# Patient Record
Sex: Female | Born: 1965 | Race: White | Hispanic: No | Marital: Married | Smoking: Former smoker
Health system: Southern US, Community
[De-identification: ages and names within clinical notes are randomized; demographics above are authoritative.]

## PROBLEM LIST (undated history)

## (undated) DIAGNOSIS — F32A Depression, unspecified: Secondary | ICD-10-CM

## (undated) DIAGNOSIS — E079 Disorder of thyroid, unspecified: Secondary | ICD-10-CM

## (undated) DIAGNOSIS — M1990 Unspecified osteoarthritis, unspecified site: Secondary | ICD-10-CM

## (undated) DIAGNOSIS — T7840XA Allergy, unspecified, initial encounter: Secondary | ICD-10-CM

## (undated) DIAGNOSIS — F419 Anxiety disorder, unspecified: Secondary | ICD-10-CM

## (undated) DIAGNOSIS — J45909 Unspecified asthma, uncomplicated: Secondary | ICD-10-CM

## (undated) DIAGNOSIS — Z1509 Genetic susceptibility to other malignant neoplasm: Secondary | ICD-10-CM

## (undated) DIAGNOSIS — Z860101 Personal history of adenomatous and serrated colon polyps: Secondary | ICD-10-CM

## (undated) DIAGNOSIS — IMO0002 Reserved for concepts with insufficient information to code with codable children: Secondary | ICD-10-CM

## (undated) DIAGNOSIS — Z86010 Personal history of colonic polyps: Secondary | ICD-10-CM

## (undated) DIAGNOSIS — N63 Unspecified lump in unspecified breast: Secondary | ICD-10-CM

## (undated) DIAGNOSIS — Z1501 Genetic susceptibility to malignant neoplasm of breast: Secondary | ICD-10-CM

## (undated) DIAGNOSIS — E7800 Pure hypercholesterolemia, unspecified: Secondary | ICD-10-CM

## (undated) DIAGNOSIS — N189 Chronic kidney disease, unspecified: Secondary | ICD-10-CM

## (undated) DIAGNOSIS — I776 Arteritis, unspecified: Secondary | ICD-10-CM

## (undated) DIAGNOSIS — F329 Major depressive disorder, single episode, unspecified: Secondary | ICD-10-CM

## (undated) HISTORY — DX: Personal history of adenomatous and serrated colon polyps: Z86.0101

## (undated) HISTORY — DX: Unspecified osteoarthritis, unspecified site: M19.90

## (undated) HISTORY — DX: Genetic susceptibility to other malignant neoplasm: Z15.09

## (undated) HISTORY — DX: Personal history of colonic polyps: Z86.010

## (undated) HISTORY — DX: Arteritis, unspecified: I77.6

## (undated) HISTORY — DX: Chronic kidney disease, unspecified: N18.9

## (undated) HISTORY — DX: Major depressive disorder, single episode, unspecified: F32.9

## (undated) HISTORY — DX: Unspecified asthma, uncomplicated: J45.909

## (undated) HISTORY — DX: Disorder of thyroid, unspecified: E07.9

## (undated) HISTORY — DX: Reserved for concepts with insufficient information to code with codable children: IMO0002

## (undated) HISTORY — PX: BLADDER SUSPENSION: SHX72

## (undated) HISTORY — DX: Allergy, unspecified, initial encounter: T78.40XA

## (undated) HISTORY — DX: Pure hypercholesterolemia, unspecified: E78.00

## (undated) HISTORY — DX: Depression, unspecified: F32.A

## (undated) HISTORY — PX: REFRACTIVE SURGERY: SHX103

## (undated) HISTORY — DX: Genetic susceptibility to malignant neoplasm of breast: Z15.01

## (undated) HISTORY — PX: EYE SURGERY: SHX253

## (undated) HISTORY — DX: Anxiety disorder, unspecified: F41.9

## (undated) HISTORY — PX: MOUTH SURGERY: SHX715

---

## 2002-06-01 HISTORY — PX: ABDOMINAL HYSTERECTOMY: SHX81

## 2013-03-24 ENCOUNTER — Ambulatory Visit: Payer: Self-pay | Admitting: Obstetrics and Gynecology

## 2013-04-07 ENCOUNTER — Encounter: Payer: Self-pay | Admitting: Obstetrics and Gynecology

## 2013-04-07 ENCOUNTER — Ambulatory Visit (INDEPENDENT_AMBULATORY_CARE_PROVIDER_SITE_OTHER): Payer: 59 | Admitting: Obstetrics and Gynecology

## 2013-04-07 ENCOUNTER — Other Ambulatory Visit: Payer: Self-pay | Admitting: Obstetrics and Gynecology

## 2013-04-07 VITALS — BP 128/80 | HR 66 | Ht 66.5 in | Wt 204.0 lb

## 2013-04-07 DIAGNOSIS — Z01419 Encounter for gynecological examination (general) (routine) without abnormal findings: Secondary | ICD-10-CM

## 2013-04-07 DIAGNOSIS — Z0000 Encounter for general adult medical examination without abnormal findings: Secondary | ICD-10-CM

## 2013-04-07 DIAGNOSIS — Z1231 Encounter for screening mammogram for malignant neoplasm of breast: Secondary | ICD-10-CM

## 2013-04-07 LAB — POCT URINALYSIS DIPSTICK
Glucose, UA: NEGATIVE
Nitrite, UA: NEGATIVE
Protein, UA: NEGATIVE
Urobilinogen, UA: NEGATIVE
pH, UA: 5

## 2013-04-07 NOTE — Progress Notes (Signed)
Patient ID: Holly Mcdaniel, female   DOB: 1965-06-15, 47 y.o.   MRN: [id number] GYNECOLOGY VISIT  PCP:   Alona Bene, MD  Referring provider:   HPI: 47 y.o.   Married  Caucasian  female   [phone] with Patient's last menstrual period was 06/01/2002.   here for   AEX. Some hot flashes, night sweats, vaginal dryness. Estroven helpful to a degree.  Manageable hot flashes.  Gel treats dryness. Sleeps with a fan on.   Doing weight watchers.  Has lost weight but has gained back some. Exercising regularly.   Hgb:     PCP Urine:   Neg  GYNECOLOGIC HISTORY: Patient's last menstrual period was 06/01/2002. Sexually active:  yes Partner preference: female Contraception:   hysterectomy Menopausal hormone therapy: no DES exposure:  no  Blood transfusions: no   Sexually transmitted diseases:   no GYN Procedures:  Total Vaginal Hysterectomy and bladder tack.  Indication was dyspareunia and possible "adenomyosis".  Still has ovaries.  Pain resolved.  Mammogram:  2010 wnl:[facility] [place]               Pap:   2010 wnl History of abnormal pap smear:  no   OB History   Grav Para Term Preterm Abortions TAB SAB Ect Mult Living   3 3 3       3        LIFESTYLE: Exercise:     Works out at gym sometimes          Tobacco:     Smokes socially x 20years Alcohol:        2-3 drinks per month Drug use:     no  OTHER HEALTH MAINTENANCE: Tetanus/TDap:    Within last year Gardisil:  NA Influenza:     03/2013 Zostavax:  NA  Bone density:  NA Colonoscopy:  NA  Cholesterol check:   10/2012 wnl   Family History  Problem Relation Age of Onset  . Diabetes Mother     borderline  . Hypertension Mother   . Thyroid disease Mother   . Asthma Brother   . Asthma Brother     There are no active problems to display for this patient.  Past Medical History  Diagnosis Date  . Depression   . Thyroid disease   . Hypercholesteremia   . Dyspareunia     Past Surgical History  Procedure Laterality Date  .  Abdominal hysterectomy  2004    TVH--still has ovaries  . Bladder suspension    . Carpal tunnel release Bilateral   . Mouth surgery      ALLERGIES: Erythromycin  Current Outpatient Prescriptions  Medication Sig Dispense Refill  . Cholecalciferol (VITAMIN D3) 2000 UNITS capsule Take 2,000 Units by mouth daily.      . CRESTOR 20 MG tablet Take 20 mg by mouth daily.      . fexofenadine (ALLEGRA) 180 MG tablet Take 180 mg by mouth daily.      Marland Kitchen levothyroxine (SYNTHROID, LEVOTHROID) 50 MCG tablet Take 50 mcg by mouth daily.      . Nutritional Supplements (ESTROVEN WEIGHT MANAGEMENT PO) Take 1 tablet by mouth 2 (two) times daily.       No current facility-administered medications for this visit.     ROS:  Pertinent items are noted in HPI.  SOCIAL HISTORY:   Married.  Works for [employer].   PHYSICAL EXAMINATION:    BP 128/80  Pulse 66  Ht 5' 6.5" (1.689 m)  [employer]  204 lb (92.534 kg)  BMI 32.44 kg/m2  LMP 06/01/2002   Wt Readings from Last 3 Encounters:  04/07/13 204 lb (92.534 kg)     Ht Readings from Last 3 Encounters:  04/07/13 5' 6.5" (1.689 m)    General appearance: alert, cooperative and appears stated age Head: Normocephalic, without obvious abnormality, atraumatic Neck: no adenopathy, supple, symmetrical, trachea midline and thyroid not enlarged, symmetric, no tenderness/mass/nodules Lungs: clear to auscultation bilaterally Breasts: Inspection negative, No nipple retraction or dimpling, No nipple discharge or bleeding, No axillary or supraclavicular adenopathy, Normal to palpation without dominant masses Heart: regular rate and rhythm Abdomen: soft, non-tender; no masses,  no organomegaly Extremities: extremities normal, atraumatic, no cyanosis or edema Skin: Skin color, texture, turgor normal. No rashes or lesions Lymph nodes: Cervical, supraclavicular, and axillary nodes normal. No abnormal inguinal nodes palpated Neurologic: Grossly normal  Pelvic: External  genitalia:  no lesions              Urethra:  normal appearing urethra with no masses, tenderness or lesions              Bartholins and Skenes: normal                 Vagina: normal appearing vagina with normal color and discharge, no lesions.  Likely midurethral sling is palpable under the mucosal of the anterior vaginal wall. No exposure or erosion.               Cervix:  absent              Pap and high risk HPV testing done: no.            Bimanual Exam:  Uterus:   absent                                      Adnexa: normal adnexa in size, nontender and no masses                                      Rectovaginal: Confirms                                      Anus:  normal sphincter tone, no lesions  ASSESSMENT  Normal gynecologic exam. Status post TVH. Status post probable midurethral sling.  Smoker.  Declines cessation.  Menopausal symptoms tolerable.   PLAN  Mammogram at [facility]. Order placed.  Patient will schedule. Pap smear and high risk HPV testing not indicated.  Labs with PCP. Colonoscopy next year.  Return annually or prn   An After Visit Summary was printed and given to the patient.

## 2013-04-07 NOTE — Patient Instructions (Signed)

## 2013-05-08 ENCOUNTER — Ambulatory Visit
Admission: RE | Admit: 2013-05-08 | Discharge: 2013-05-08 | Disposition: A | Discharge status: Home / Self Care | Payer: 59 | Source: Ambulatory Visit | Attending: Obstetrics and Gynecology | Admitting: Obstetrics and Gynecology

## 2013-05-08 DIAGNOSIS — Z1231 Encounter for screening mammogram for malignant neoplasm of breast: Secondary | ICD-10-CM

## 2013-06-01 HISTORY — PX: CHOLECYSTECTOMY: SHX55

## 2013-11-29 HISTORY — PX: FOOT FRACTURE SURGERY: SHX645

## 2014-02-13 ENCOUNTER — Encounter: Payer: Self-pay | Admitting: Obstetrics and Gynecology

## 2014-04-02 ENCOUNTER — Encounter: Payer: Self-pay | Admitting: Obstetrics and Gynecology

## 2014-04-09 ENCOUNTER — Ambulatory Visit: Payer: 59 | Admitting: Obstetrics and Gynecology

## 2014-04-13 ENCOUNTER — Ambulatory Visit (INDEPENDENT_AMBULATORY_CARE_PROVIDER_SITE_OTHER): Payer: 59 | Admitting: Obstetrics and Gynecology

## 2014-04-13 ENCOUNTER — Encounter: Payer: Self-pay | Admitting: Obstetrics and Gynecology

## 2014-04-13 ENCOUNTER — Other Ambulatory Visit: Payer: Self-pay

## 2014-04-13 VITALS — BP 126/88 | HR 72 | Resp 16 | Ht 66.5 in | Wt 219.0 lb

## 2014-04-13 DIAGNOSIS — Z1231 Encounter for screening mammogram for malignant neoplasm of breast: Secondary | ICD-10-CM

## 2014-04-13 DIAGNOSIS — Z0000 Encounter for general adult medical examination without abnormal findings: Secondary | ICD-10-CM

## 2014-04-13 DIAGNOSIS — R140 Abdominal distension (gaseous): Secondary | ICD-10-CM

## 2014-04-13 DIAGNOSIS — R8290 Unspecified abnormal findings in urine: Secondary | ICD-10-CM

## 2014-04-13 DIAGNOSIS — Z01419 Encounter for gynecological examination (general) (routine) without abnormal findings: Secondary | ICD-10-CM

## 2014-04-13 LAB — POCT URINALYSIS DIPSTICK
Bilirubin, UA: NEGATIVE
Glucose, UA: NEGATIVE
Ketones, UA: NEGATIVE
NITRITE UA: NEGATIVE
PROTEIN UA: NEGATIVE
UROBILINOGEN UA: NEGATIVE
pH, UA: 7

## 2014-04-13 NOTE — Patient Instructions (Signed)

## 2014-04-13 NOTE — Progress Notes (Signed)
48 y.o. [id number] MarriedCaucasianF here for annual exam.   Hot flashes and weight gain.  Taking Estroven which helps.  Worried about weight gain with HRT. Had foot surgery and not able to exercise.  Did Weight Watchers and lost 30 pounds but gained almost everything back.   Having abdominal pain and loose stools. Episodes of bloating and gas and nausea. Happened after eatinng soup with sausage. Had an appointment with PCP.   Promotions at work.  Patient's last menstrual period was 06/01/2002.          Sexually active: Yes.    The current method of family planning is status post hysterectomy.   Still has ovaries.  Exercising: No.  The patient does not participate in regular exercise at present. Smoker:  no  Health Maintenance: Pap: 2010 Normal History of abnormal Pap:  no MMG: 05/2013 BIRADS1: neg Colonoscopy:  N/A BMD:   N/A TDaP: within 5 years  Screening Labs: PCP, Hb today: PCP, Urine today:  Trace RBCs, small WBCs.    reports that she has been smoking Cigarettes.  She has been smoking about 0.00 packs per day for the past 20 years. She has never used smokeless tobacco. She reports that she drinks alcohol. She reports that she does not use illicit drugs.  Past Medical History  Diagnosis Date  . Depression   . Thyroid disease   . Hypercholesteremia   . Dyspareunia     Past Surgical History  Procedure Laterality Date  . Abdominal hysterectomy  2004    TVH--still has ovaries  . Bladder suspension    . Carpal tunnel release Bilateral   . Mouth surgery    . Foot fracture surgery Right 11/2013    Current Outpatient Prescriptions  Medication Sig Dispense Refill  . Cholecalciferol (VITAMIN D3) 2000 UNITS capsule Take 2,000 Units by mouth daily.    . CRESTOR 20 MG tablet Take 20 mg by mouth daily.    . fexofenadine (ALLEGRA) 180 MG tablet Take 180 mg by mouth daily.    Marland Kitchen. levothyroxine (SYNTHROID, LEVOTHROID) 50 MCG tablet Take 50 mcg by mouth daily.    . Nutritional  Supplements (ESTROVEN WEIGHT MANAGEMENT PO) Take 1 tablet by mouth 2 (two) times daily.    Marland Kitchen. albuterol (PROVENTIL HFA;VENTOLIN HFA) 108 (90 BASE) MCG/ACT inhaler Inhale 2 puffs into the lungs.     No current facility-administered medications for this visit.    Family History  Problem Relation Age of Onset  . Diabetes Mother     borderline  . Hypertension Mother   . Thyroid disease Mother   . Asthma Brother   . Asthma Brother     ROS:  Pertinent items are noted in HPI.  Otherwise, a comprehensive ROS was negative.  Exam:   BP 126/88 mmHg  Pulse 72  Resp 16  Ht 5' 6.5" (1.689 m)  Wt 219 lb (99.338 kg)  BMI 34.82 kg/m2  LMP 06/01/2002    Height: 5' 6.5" (168.9 cm)  Ht Readings from Last 3 Encounters:  04/13/14 5' 6.5" (1.689 m)  04/07/13 5' 6.5" (1.689 m)    General appearance: alert, cooperative and appears stated age Head: Normocephalic, without obvious abnormality, atraumatic Neck: no adenopathy, supple, symmetrical, trachea midline and thyroid normal to inspection and palpation Lungs: clear to auscultation bilaterally Breasts: normal appearance, no masses or tenderness, Inspection negative, No nipple retraction or dimpling, No nipple discharge or bleeding, No axillary or supraclavicular adenopathy Heart: regular rate and rhythm Abdomen: soft, non-tender; bowel sounds  normal; no masses,  no organomegaly Extremities: extremities normal, atraumatic, no cyanosis or edema Skin: Skin color, texture, turgor normal. No rashes or lesions Lymph nodes: Cervical, supraclavicular, and axillary nodes normal. No abnormal inguinal nodes palpated Neurologic: Grossly normal   Pelvic: External genitalia:  no lesions              Urethra:  normal appearing urethra with no masses, tenderness or lesions              Bartholins and Skenes: normal                 Vagina: normal appearing vagina with normal color and discharge, no lesions              Cervix: absent              Pap taken:  No. Bimanual Exam:  Uterus:  uterus absent              Adnexa: normal adnexa and no mass, fullness, tenderness               Rectovaginal: Confirms               Anus:  normal sphincter tone, no lesions  A:  Well Woman with normal exam Status post TVH and probably midurethral sling. Still has ovaries. Abdominal bloating and pain.  Menopausal symptoms.  Declines ERT. Weight gain.  Positive urine dip with RBCs and WBCs.   P:   Mammogram due.  Patient will call to schedule.  pap smear not indicated.  Return for pelvic ultrasound to check ovaries.  Patient will see PCP for evaluation of abdominal pain as well.  Estroven.  Increase exercise and resume Weight Watchers.  Urine micro and culture.  return annually or prn  An After Visit Summary was printed and given to the patient.

## 2014-04-14 LAB — URINE CULTURE

## 2014-04-14 LAB — URINALYSIS, MICROSCOPIC ONLY
Bacteria, UA: NONE SEEN
Casts: NONE SEEN
Crystals: NONE SEEN

## 2014-04-16 ENCOUNTER — Telehealth: Payer: Self-pay | Admitting: Obstetrics and Gynecology

## 2014-04-16 NOTE — Telephone Encounter (Signed)
Spoke with patient. Advised that she will be responsible for a $20 copay when she comes in for PUS. Patient agreeable. Scheduled PUS. Advised patient of 72 hour cancellation policy and $100 cancellation fee. Patient agreeable.

## 2014-04-30 ENCOUNTER — Telehealth: Payer: Self-pay | Admitting: Obstetrics and Gynecology

## 2014-04-30 NOTE — Telephone Encounter (Signed)
Patient called and cancelled her PUS for 05/03/14 due to had emergency gall bladder surgery over the weekend and the "ultrasound is not needed anymore."

## 2014-04-30 NOTE — Telephone Encounter (Signed)
Dr. Edward JollySilva, can you review and advise? Contact patient reschedule for evaluation of ovaries?

## 2014-04-30 NOTE — Telephone Encounter (Signed)
OK to cancel the pelvic ultrasound!

## 2014-05-01 NOTE — Telephone Encounter (Signed)
Ultrasound DC per Dr. Edward JollySilva  .

## 2014-05-03 ENCOUNTER — Other Ambulatory Visit: Payer: 59 | Admitting: Obstetrics and Gynecology

## 2014-05-03 ENCOUNTER — Other Ambulatory Visit: Payer: 59

## 2014-05-10 ENCOUNTER — Ambulatory Visit
Admission: RE | Admit: 2014-05-10 | Discharge: 2014-05-10 | Disposition: A | Discharge status: Home / Self Care | Payer: 59 | Source: Ambulatory Visit

## 2014-05-10 DIAGNOSIS — Z1231 Encounter for screening mammogram for malignant neoplasm of breast: Secondary | ICD-10-CM

## 2014-06-01 HISTORY — PX: BREAST BIOPSY: SHX20

## 2014-12-28 ENCOUNTER — Telehealth: Payer: Self-pay | Admitting: Obstetrics and Gynecology

## 2014-12-28 NOTE — Telephone Encounter (Signed)
Thank you for the update.  I agree with diagnostic imaging.  Results will need to be forwarded to our office.  Thank you.

## 2014-12-28 NOTE — Telephone Encounter (Signed)
Call to patient. She had a work health fair and had a screening mammogram completed on 12/18/14 with [facility]. Patient states she was called back for additional imaging and scheduled for diagnostic imaging for Tuesday morning, 01/01/15. Patient states that the person who called her to schedule was not able to give her details of results just that she needed follow up and patient calling for advice. Reviewed mammogram on care everywhere. Advised evaluation of L Breast recommended as asymmetry was seen on mammogram. Advised follow up is appropriate and that diagnostic imaging of L Breast will be completed and possibly ultrasound. Advised she would see a radiologist at time of appointment and know her results then. Patient denies any breast complaints at this time. Advised will request records so that her chart here can be updated and if any further concerns after reviewing imaging would call patient. Patient agreeable.  Placed in mammogram hold.  Mammo Screening Bil7/19/2016  [facility]  Mammo Screening Bil7/19/2016  [facility]  Result Narrative  [id number]   Attending MD:  Alona Bene [id number]   Ordering MD:  Alona Bene Date of Birth:  [birth year]    Sex: F Admit Date:  12/19/2014 08:36    ###FINAL RESULT###     INDICATIONS:  COMMENTS:      PROCEDURE:  QMS 5503- DIGITAL SCREEN MAMMO W/CAD - Dec 19 2014    [employer] Accession #: [id number] [facility] Accession #: [id number]  DIGITAL SCREEN MAMMO W/CAD    TECHNIQUE:  Bilateral digital screening mammogram. This mammogram was  analyzed by a CAD (Computer-Aided Detection) system.  COMPARISON: April 21, 2006  FINDINGS: The breasts are almost entirely fatty.  No significant changes have occurred on the right.  On the left craniocaudal view there is an asymmetry in the outer aspect  of the breast, mid depth, questionably present on the prior exam but  obscured. Questionable correlate on the MLO view, superior  aspect.  Additional Imaging Recommended: Spot compression views, rolled views, 90?  view and possible ultrasound.  We will make every effort to contact and schedule your patient.  If we  are not successful, we will notify your office.  A full report will  follow the completion of these studies.     IMPRESSION:  Incomplete.    RECOMMENDATION:  Additional imaging evaluation of the left breast.  ACR BI-RADS CATEGORY 0 -- INCOMPLETE:  NEED ADDITIONAL IMAGING EVALUATION. Letter Sent:  Additional Imaging Needed.     TECHNOLOGIST:  _________________________________________________________ Current Report Read byConcepcion Elk on Dec 19 2014  4:26P Transcribed byJenetta Downer   on Dec 19 2014  4:28P Electronically Signed by:  DR. Lois Huxley on:  Dec 19 2014  4:28P

## 2014-12-28 NOTE — Telephone Encounter (Signed)
Patient has some questions regarding her recent MMG results.

## 2014-12-31 HISTORY — PX: BREAST SURGERY: SHX581

## 2014-12-31 HISTORY — PX: OTHER SURGICAL HISTORY: SHX169

## 2015-01-04 NOTE — Telephone Encounter (Signed)
Continued mammogram hold for diagnostic imaging and biopsy results.  Will close encounter.

## 2015-02-11 HISTORY — PX: COLONOSCOPY: SHX174

## 2015-05-08 ENCOUNTER — Ambulatory Visit: Payer: 59 | Admitting: Obstetrics and Gynecology

## 2015-06-02 HISTORY — PX: CARPAL TUNNEL RELEASE: SHX101

## 2015-06-28 ENCOUNTER — Ambulatory Visit: Payer: Self-pay | Admitting: Obstetrics and Gynecology

## 2015-07-12 ENCOUNTER — Encounter: Payer: Self-pay | Admitting: Obstetrics and Gynecology

## 2015-07-12 ENCOUNTER — Ambulatory Visit (INDEPENDENT_AMBULATORY_CARE_PROVIDER_SITE_OTHER): Payer: 59 | Admitting: Obstetrics and Gynecology

## 2015-07-12 VITALS — BP 130/80 | HR 70 | Resp 16 | Ht 66.0 in | Wt 215.4 lb

## 2015-07-12 DIAGNOSIS — Z01419 Encounter for gynecological examination (general) (routine) without abnormal findings: Secondary | ICD-10-CM

## 2015-07-12 NOTE — Progress Notes (Signed)
Patient ID: Holly Mcdaniel, female   DOB: [birth year], 50 y.o.   MRN: [id number] 50 y.o. [id number] Married Caucasian female here for annual exam.   Battling her weight.  Too busy to exercise.  Had foot surgery. In school. Restricting her diet.  Thyroid is in balance.   Hot flashes all the time.  Sleeping though them. Jeffie Pollock may be helping.   Good bladder control.   PCP:  Alona Bene, MD   Patient's last menstrual period was 06/01/2002.          Sexually active: Yes.  female  The current method of family planning is status post hysterectomy.    Exercising: No.   Smoker:  Former;quit 5-10 years ago  Health Maintenance: Pap:  2010 Neg History of abnormal Pap:  no MMG:  12-28-14 Poss.mass Lt.Br;had Bil.Diag.and U/S on 01-09-15---see Epic  Had biopsy and has a fibroma of left breast. Colonoscopy:  2016 polyps with GI in [place];next due 2021. BMD:   n/a  Result  n/a TDaP:  2010 Screening Labs:  Hb today: PCP, Urine today: PCP   reports that she quit smoking about 6 years ago. Her smoking use included Cigarettes. She quit after 20 years of use. She has never used smokeless tobacco. She reports that she drinks about 0.6 oz of alcohol per week. She reports that she does not use illicit drugs.  Past Medical History  Diagnosis Date  . Depression   . Thyroid disease   . Hypercholesteremia   . Dyspareunia   . Childhood asthma     Past Surgical History  Procedure Laterality Date  . Abdominal hysterectomy  2004    TVH--still has ovaries  . Bladder suspension    . Carpal tunnel release Bilateral   . Mouth surgery    . Foot fracture surgery Right 11/2013  . Breast surgery  12/2014    Benign Lt.breast bx  . Breast biopsy Left August 2016    fibroma    Current Outpatient Prescriptions  Medication Sig Dispense Refill  . albuterol (PROVENTIL HFA;VENTOLIN HFA) 108 (90 BASE) MCG/ACT inhaler Inhale 2 puffs into the lungs. Takes prn    . Cholecalciferol (VITAMIN D3) 2000 UNITS capsule  Take 2,000 Units by mouth daily.    . CRESTOR 20 MG tablet Take 20 mg by mouth daily.    . fexofenadine (ALLEGRA) 180 MG tablet Take 180 mg by mouth daily.    Marland Kitchen levothyroxine (SYNTHROID, LEVOTHROID) 50 MCG tablet Take 50 mcg by mouth daily.    . Nutritional Supplements (ESTROVEN WEIGHT MANAGEMENT PO) Take 1 tablet by mouth daily.      No current facility-administered medications for this visit.    Family History  Problem Relation Age of Onset  . Diabetes Mother     borderline  . Hypertension Mother   . Thyroid disease Mother   . Asthma Brother   . Asthma Brother     ROS:  Pertinent items are noted in HPI.  Otherwise, a comprehensive ROS was negative.  Exam:   BP 130/80 mmHg  Pulse 70  Resp 16  Ht  (1.676 m)  Wt 215 lb 6.4 oz (97.705 kg)  BMI 34.78 kg/m2  LMP 06/01/2002    General appearance: alert, cooperative and appears stated age Head: Normocephalic, without obvious abnormality, atraumatic Neck: no adenopathy, supple, symmetrical, trachea midline and thyroid normal to inspection and palpation Lungs: clear to auscultation bilaterally Breasts: normal appearance, no masses or tenderness, Inspection negative, No nipple retraction or dimpling, No  nipple discharge or bleeding, No axillary or supraclavicular adenopathy Heart: regular rate and rhythm Abdomen: soft, non-tender; bowel sounds normal; no masses,  no organomegaly Extremities: extremities normal, atraumatic, no cyanosis or edema Skin: Skin color, texture, turgor normal. No rashes or lesions Lymph nodes: Cervical, supraclavicular, and axillary nodes normal. No abnormal inguinal nodes palpated Neurologic: Grossly normal  Pelvic: External genitalia:  no lesions              Urethra:  normal appearing urethra with no masses, tenderness or lesions              Bartholins and Skenes: normal                 Vagina: normal appearing vagina with normal color and discharge, no lesions              Cervix: absent               Pap taken: No. Bimanual Exam:  Uterus:  uterus absent              Adnexa: normal adnexa and no mass, fullness, tenderness              Rectovaginal: Yes.  .  Confirms.              Anus:  normal sphincter tone, no lesions  Chaperone was present for exam.  Assessment:   Well woman visit with normal exam. Status post TVH and probably midurethral sling. Still has ovaries. Menopausal symptoms.  Weight gain.  Left breast fibroma.   Plan: Yearly mammogram recommended after age 20.  Recommended self breast exam.  Pap and HR HPV as above. Discussed Calcium, Vitamin D, regular exercise program including cardiovascular and weight bearing exercise. Discussed weight loss.  Needs to increase exercise and change calories.  Labs performed.  No..     Refills given on medications.  No..    Discussed estrogens, SSRIs/SNRIs, and herbal products.  Declines Rx.  Follow up annually and prn.      After visit summary provided.

## 2015-07-12 NOTE — Patient Instructions (Signed)
EXERCISE AND DIET:  We recommended that you start or continue a regular exercise program for good health. Regular exercise means any activity that makes your heart beat faster and makes you sweat.  We recommend exercising at least 30 minutes per day at least 3 days a week, preferably 4 or 5.  We also recommend a diet low in fat and sugar.  Inactivity, poor dietary choices and obesity can cause diabetes, heart attack, stroke, and kidney damage, among others.    ALCOHOL AND SMOKING:  Women should limit their alcohol intake to no more than 7 drinks/beers/glasses of wine (combined, not each!) per week. Moderation of alcohol intake to this level decreases your risk of breast cancer and liver damage. And of course, no recreational drugs are part of a healthy lifestyle.  And absolutely no smoking or even second hand smoke. Most people know smoking can cause heart and lung diseases, but did you know it also contributes to weakening of your bones? Aging of your skin?  Yellowing of your teeth and nails?  CALCIUM AND VITAMIN D:  Adequate intake of calcium and Vitamin D are recommended.  The recommendations for exact amounts of these supplements seem to change often, but generally speaking 600 mg of calcium (either carbonate or citrate) and 800 units of Vitamin D per day seems prudent. Certain women may benefit from higher intake of Vitamin D.  If you are among these women, your doctor will have told you during your visit.    PAP SMEARS:  Pap smears, to check for cervical cancer or precancers,  have traditionally been done yearly, although recent scientific advances have shown that most women can have pap smears less often.  However, every woman still should have a physical exam from her gynecologist every year. It will include a breast check, inspection of the vulva and vagina to check for abnormal growths or skin changes, a visual exam of the cervix, and then an exam to evaluate the size and shape of the uterus and  ovaries.  And after 52 years of age, a rectal exam is indicated to check for rectal cancers. We will also provide age appropriate advice regarding health maintenance, like when you should have certain vaccines, screening for sexually transmitted diseases, bone density testing, colonoscopy, mammograms, etc.   MAMMOGRAMS:  All women over 40 years old should have a yearly mammogram. Many facilities now offer a "3D" mammogram, which may cost around $50 extra out of pocket. If possible,  we recommend you accept the option to have the 3D mammogram performed.  It both reduces the number of women who will be called back for extra views which then turn out to be normal, and it is better than the routine mammogram at detecting truly abnormal areas.    COLONOSCOPY:  Colonoscopy to screen for colon cancer is recommended for all women at age 50.  We know, you hate the idea of the prep.  We agree, BUT, having colon cancer and not knowing it is worse!!  Colon cancer so often starts as a polyp that can be seen and removed at colonscopy, which can quite literally save your life!  And if your first colonoscopy is normal and you have no family history of colon cancer, most women don't have to have it again for 10 years.  Once every ten years, you can do something that may end up saving your life, right?  We will be happy to help you get it scheduled when you are ready.    Be sure to check your insurance coverage so you understand how much it will cost.  It may be covered as a preventative service at no cost, but you should check your particular policy.     Menopause and Herbal Products WHAT IS MENOPAUSE? Menopause is the normal time of life when menstrual periods decrease in frequency and eventually stop completely. This process can take several years for some women. Menopause is complete when you have had an absence of menstruation for a full year since your last menstrual period. It usually occurs between the ages of 48 and  55. It is not common for menopause to begin before the age of 40. During menopause, your body stops producing the female hormones estrogen and progesterone. Common symptoms associated with this loss of hormones (vasomotor symptoms) are:  Hot flashes.  Hot flushes.  Night sweats. Other common symptoms and complications of menopause include:  Decrease in sex drive.  Vaginal dryness and thinning of the walls of the vagina. This can make sex painful.  Dryness of the skin and development of wrinkles.  Headaches.  Tiredness.  Irritability.  Memory problems.  Weight gain.  Bladder infections.  Hair growth on the face and chest.  Inability to reproduce offspring (infertility).  Loss of density in the bones (osteoporosis) increasing your risk for breaks (fractures).  Depression.  Hardening and narrowing of the arteries (atherosclerosis). This increases your risk of heart attack and stroke. WHAT TREATMENT OPTIONS ARE AVAILABLE? There are many treatment choices for menopause symptoms. The most common treatment is hormone replacement therapy. Many alternative therapies for menopause are emerging, including the use of herbal products. These supplements can be found in the form of herbs, teas, oils, tinctures, and pills. Common herbal supplements for menopause are made from plants that contain phytoestrogens. Phytoestrogens are compounds that occur naturally in plants and plant products. They act like estrogen in the body. Foods and herbs that contain phytoestrogens include:  Soy.  Flax seeds.  Red clover.  Ginseng. WHAT MENOPAUSE SYMPTOMS MAY BE HELPED IF I USE HERBAL PRODUCTS?  Vasomotor symptoms. These may be helped by:  Soy. Some studies show that soy may have a moderate benefit for hot flashes.  Black cohosh. There is limited evidence indicating this may be beneficial for hot flashes.  Symptoms that are related to heart and blood vessel disease. These may be helped by  soy. Studies have shown that soy can help to lower cholesterol.  Depression. This may be helped by:  St. John's wort. There is limited evidence that shows this may help mild to moderate depression.  Black cohosh. There is evidence that this may help depression and mood swings.  Osteoporosis. Soy may help to decrease bone loss that is associated with menopause and may prevent osteoporosis. Limited evidence indicates that red clover may offer some bone loss protection as well. Other herbal products that are commonly used during menopause lack enough evidence to support their use as a replacement for conventional menopause therapies. These products include evening primrose, ginseng, and red clover. WHAT ARE THE CASES WHEN HERBAL PRODUCTS SHOULD NOT BE USED DURING MENOPAUSE? Do not use herbal products during menopause without your health care provider's approval if:  You are taking medicine.  You have a preexisting liver condition. ARE THERE ANY RISKS IN MY TAKING HERBAL PRODUCTS DURING MENOPAUSE? If you choose to use herbal products to help with symptoms of menopause, keep in mind that:  Different supplements have different and unmeasured amounts of herbal ingredients.    Herbal products are not regulated the same way that medicines are.  Concentrations of herbs may vary depending on the way they are prepared. For example, the concentration may be different in a pill, tea, oil, and tincture.  Little is known about the risks of using herbal products, particularly the risks of long-term use.  Some herbal supplements can be harmful when combined with certain medicines. Most commonly reported side effects of herbal products are mild. However, if used improperly, many herbal supplements can cause serious problems. Talk to your health care provider before starting any herbal product. If problems develop, stop taking the supplement and let your health care provider know.   This information is not  intended to replace advice given to you by your health care provider. Make sure you discuss any questions you have with your health care provider.   Document Released: 11/04/2007 Document Revised: 06/08/2014 Document Reviewed: 10/31/2013 Elsevier Interactive Patient Education 2016 [employer] Inc.  

## 2016-06-30 ENCOUNTER — Ambulatory Visit (INDEPENDENT_AMBULATORY_CARE_PROVIDER_SITE_OTHER): Payer: 59 | Admitting: Nurse Practitioner

## 2016-06-30 ENCOUNTER — Encounter: Payer: Self-pay | Admitting: Nurse Practitioner

## 2016-06-30 ENCOUNTER — Telehealth: Payer: Self-pay | Admitting: Obstetrics and Gynecology

## 2016-06-30 VITALS — BP 110/70 | HR 64 | Ht 66.0 in | Wt 216.0 lb

## 2016-06-30 DIAGNOSIS — N63 Unspecified lump in unspecified breast: Secondary | ICD-10-CM | POA: Diagnosis not present

## 2016-06-30 NOTE — Progress Notes (Signed)
   Subjective:   51 y.o. Married Caucasian female presents for evaluation of right breast mass and tenderness noted this am in the shower.  Area was tender and feels like a "rash" but not warm or itching.  Post hysterectomy for adenomyosis.  Ovaries remain.   Usually gets no PMS symptoms like breast tenderness with a cycle.  Patient sought evaluation because of breast lump, breast tenderness and skin color change.  Contributing factors include family hx on father's side of breast cancer MA age 53; and family hx of father with Pancreatic CA at age 53 who is passed.  Denies constitutional symptoms.. Patient denies history of trauma, bites, or injuries. Last mammogram was August 8,2017 at mobile unit at work and was negative (care everywhere).   Previous evaluation has included Mammo and US on the left.   Review of Systems Pertinent items are noted in HPI.SUBJECTIVE   Objective:   General appearance: alert, cooperative and appears stated age Head: Normocephalic, without obvious abnormality, atraumatic Neck: supple, symmetrical, trachea midline and thyroid not enlarged, symmetric, no tenderness/mass/nodules Back: symmetric, no curvature. ROM normal. No CVA tenderness. Lungs: clear to auscultation bilaterally Breasts: normal appearance, no masses or tenderness, on the left.  On the right at12:00 position there is a pink area  that she says is tender.  There are fibrocystic changes but also a mobile mass at this area.  When manipulating the breast to midline there is also a dimpling at 11:00 position adjacent to the area of concern.  No lymph nodes but tender on the left.  Heart: regular rate and rhythm Abdomen: normal findings: no masses palpable    Assessment:   ASSESSMENT:Patient is diagnosed with breast mass, fibrocystic changes and mastalgia   Plan:   PLAN: The patient has a documented plan to follow with further care of diagnostic plan on 07/10/16. 2. PLAN: FOLLOW as needed after results.

## 2016-06-30 NOTE — Telephone Encounter (Signed)
Patient states her right breast is tender to the touch and also is sore up under her armpit.

## 2016-06-30 NOTE — Patient Instructions (Signed)
Will arrange diagnostic Mammo and [place]

## 2016-06-30 NOTE — Progress Notes (Signed)
Spoke with Victorino DikeJennifer at [facility]. Patient scheduled while in office for diagnostic right breast MMG and ultrasound 07/10/16 arriving at 0820, appointment at 0840. Patient was advised per [facility] that can take 1-1.5 weeks to receive images from previous imaging facility, [facility]. Advised patient request will be sent from [facility] 06/30/16, patient can f/u with medical records at [facility] and move appointment up if images received earlier. Patient states she will also place call to [facility] to see if she may be able to pick-up images sooner. Patient verbalizes understanding and is agreeable.

## 2016-06-30 NOTE — Telephone Encounter (Addendum)
Spoke with patient. Patient states she noticed a sore area on right breast close to nipple this morning while in shower. Patient states area is tender and "needlely" feeling. Patient reports soreness under armpit. Patient denies any discharge, states area is red. Patient states last MMG she had a "tumpr" on left breast that required biopsy. Patient history includes hysterectomy. Patient states she drinks 3 cups of caffinated coffee in morning and occassionally a coke zero. Recommended OV for further evaluation, advised patient Dr. Edward JollySilva is out of the office, can schedule with covering provider. Patient scheduled with Ria CommentPatricia Grubb, NP 06/30/16 at 3pm. Patient is agreeable to date and time.  Routing to provider for final review. Patient is agreeable to disposition. Will close encounter.  Cc: Dr. Edward JollySilva

## 2016-07-01 NOTE — Progress Notes (Signed)
Encounter reviewed by Dr. Brook Amundson C. Silva.  

## 2016-07-06 ENCOUNTER — Ambulatory Visit
Admission: RE | Admit: 2016-07-06 | Discharge: 2016-07-06 | Disposition: A | Discharge status: Home / Self Care | Payer: 59 | Source: Ambulatory Visit | Attending: Nurse Practitioner | Admitting: Nurse Practitioner

## 2016-07-06 DIAGNOSIS — N6489 Other specified disorders of breast: Secondary | ICD-10-CM | POA: Diagnosis not present

## 2016-07-06 DIAGNOSIS — R928 Other abnormal and inconclusive findings on diagnostic imaging of breast: Secondary | ICD-10-CM | POA: Diagnosis not present

## 2016-07-06 DIAGNOSIS — N63 Unspecified lump in unspecified breast: Secondary | ICD-10-CM

## 2016-07-06 HISTORY — DX: Unspecified lump in unspecified breast: N63.0

## 2016-07-10 ENCOUNTER — Other Ambulatory Visit: Payer: Self-pay

## 2016-07-24 ENCOUNTER — Other Ambulatory Visit: Payer: Self-pay | Admitting: Obstetrics and Gynecology

## 2016-07-24 DIAGNOSIS — Z1231 Encounter for screening mammogram for malignant neoplasm of breast: Secondary | ICD-10-CM

## 2016-08-05 NOTE — Progress Notes (Signed)
51 y.o. [id number] Married Caucasian female here for annual exam.    Patient complaining of possible vaginal spotting or blood in urine. She states only sees blood with wiping after urinating.  Symptoms for one month.   No dysuria.  Increased frequency due to increased water intake.   No dyspareunia or post coital bleeding.  Not frequently sexually active.  Patient states that right breast lump has now resolved.  Had a normal diagnostic study on the right.   Lost 22 pounds this year through diet and exercise.  PCP:  Alysia PennaScott Holwerda, MD - will be a new patient soon.   Patient's last menstrual period was 06/01/2002 (approximate).           Sexually active: Yes.   female The current method of family planning is status post hysterectomy.    Exercising: Yes.    Spinning, eliptical and weights Smoker:  former  Health Maintenance: Pap: 2010 Neg  History of abnormal Pap:  no MMG:07-06-16 Rt.Bt.US Density B/Neg/BiRads1:TBC/ 01-01-15 Bil.Diag.--Rt.Br.Neg, Single mass in Lt.Br. And recommend U/S guided bx.--Pathology revealed sclerotic fibroadenoma. 07-06-16 Rt.Diag.and US Neg/BiRads1:TBC--screening rec in August 2018.  Colonoscopy: [place];next due 2021. BMD:   n/a  Result  n/a TDaP:  2010 Gardasil:   N/A HIV:  Used to donate blood/platelets in the past.  Hep C:  Will do with PCP.  Screening Labs:  Hb today: PCP, Urine today: Trace WBCs and no RBCs.   reports that she quit smoking about 7 years ago. Her smoking use included Cigarettes. She quit after 20.00 years of use. She has never used smokeless tobacco. She reports that she drinks about 0.6 oz of alcohol per week . She reports that she does not use drugs.  Past Medical History:  Diagnosis Date  . Breast mass   . Childhood asthma   . Depression   . Dyspareunia   . Hx of adenomatous colonic polyps   . Hypercholesteremia   . Thyroid disease     Past Surgical History:  Procedure Laterality Date  . ABDOMINAL  HYSTERECTOMY  2004   TVH--still has ovaries  . BLADDER SUSPENSION    . breast biopsy Left August 2016   fibroma  . BREAST BIOPSY    . BREAST SURGERY  12/2014   Benign Lt.breast bx  . CARPAL TUNNEL RELEASE Bilateral   . FOOT FRACTURE SURGERY Right 11/2013  . MOUTH SURGERY      Current Outpatient Prescriptions  Medication Sig Dispense Refill  . albuterol (PROVENTIL HFA;VENTOLIN HFA) 108 (90 BASE) MCG/ACT inhaler Inhale 2 puffs into the lungs. Takes prn    . amoxicillin (AMOXIL) 875 MG tablet Take 1 tablet by mouth 2 (two) times daily.    . Cholecalciferol (VITAMIN D3) 2000 UNITS capsule Take 2,000 Units by mouth daily.    Marland Kitchen. COLY-MYCIN S 3.08-01-08-0.5 MG/ML otic suspension Place 5 drops into the left ear 3 (three) times daily.    . CRESTOR 20 MG tablet Take 20 mg by mouth daily.    . fexofenadine (ALLEGRA) 180 MG tablet Take 180 mg by mouth daily.    Marland Kitchen. levothyroxine (SYNTHROID, LEVOTHROID) 50 MCG tablet Take 50 mcg by mouth daily.    . Nutritional Supplements (ESTROVEN WEIGHT MANAGEMENT PO) Take 1 tablet by mouth daily.      No current facility-administered medications for this visit.     Family History  Problem Relation Age of Onset  . Diabetes Mother     borderline  . Hypertension  Mother   . Thyroid disease Mother   . Heart failure Brother   . Hypertension Brother   . Heart failure Brother   . Pancreatic cancer Father   . Colon polyps Sister 71    precancer  . Asthma Brother   . Breast cancer Paternal Aunt 59    lumpectomy and survived    ROS:  Pertinent items are noted in HPI.  Otherwise, a comprehensive ROS was negative.  Exam:   BP 138/76 (BP Location: Right Arm, Patient Position: Sitting, Cuff Size: Normal)   Pulse 70   Resp 14   Ht 5' 6.5" (1.689 m)   Wt 208 lb 9.6 oz (94.6 kg)   LMP 06/01/2002 (Approximate)   BMI 33.16 kg/m     General appearance: alert, cooperative and appears stated age Head: Normocephalic, without obvious abnormality, atraumatic Neck: no  adenopathy, supple, symmetrical, trachea midline and thyroid normal to inspection and palpation Lungs: clear to auscultation bilaterally Breasts: normal appearance, no masses or tenderness, No nipple retraction or dimpling, No nipple discharge or bleeding, No axillary or supraclavicular adenopathy Heart: regular rate and rhythm Abdomen: soft, non-tender; no masses, no organomegaly Extremities: extremities normal, atraumatic, no cyanosis or edema Skin: Skin color, texture, turgor normal. No rashes or lesions Lymph nodes: Cervical, supraclavicular, and axillary nodes normal. No abnormal inguinal nodes palpated Neurologic: Grossly normal  Pelvic: External genitalia:  no lesions              Urethra:  normal appearing urethra with no masses, tenderness or lesions              Bartholins and Skenes: normal                 Vagina: normal appearing vagina with normal color and discharge, no lesions              Cervix:  absent              Pap taken: No. Bimanual Exam:  Uterus:  absent              Adnexa: no mass, fullness, tenderness              Rectal exam: Yes.  .  Confirms.  External hemorrhoid noted.              Anus:  normal sphincter tone, no lesions  Chaperone was present for exam.  Assessment:   Well woman visit with normal exam. Status post TVH and probably midurethral sling. Still has ovaries. ?hematuria.  Hx left breast fibroma.   Plan: Mammogram screening discussed. Recommended self breast awareness. Pap and HR HPV as above. Guidelines for Calcium, Vitamin D, regular exercise program including cardiovascular and weight bearing exercise. Urine micro and culture. Follow up annually and prn.   After visit summary provided.

## 2016-08-07 ENCOUNTER — Ambulatory Visit (INDEPENDENT_AMBULATORY_CARE_PROVIDER_SITE_OTHER): Payer: 59 | Admitting: Obstetrics and Gynecology

## 2016-08-07 ENCOUNTER — Encounter: Payer: Self-pay | Admitting: Obstetrics and Gynecology

## 2016-08-07 VITALS — BP 138/76 | HR 70 | Resp 14 | Ht 66.5 in | Wt 208.6 lb

## 2016-08-07 DIAGNOSIS — Z01419 Encounter for gynecological examination (general) (routine) without abnormal findings: Secondary | ICD-10-CM | POA: Diagnosis not present

## 2016-08-07 DIAGNOSIS — R8299 Other abnormal findings in urine: Secondary | ICD-10-CM | POA: Diagnosis not present

## 2016-08-07 DIAGNOSIS — Z0000 Encounter for general adult medical examination without abnormal findings: Secondary | ICD-10-CM | POA: Diagnosis not present

## 2016-08-07 DIAGNOSIS — R82998 Other abnormal findings in urine: Secondary | ICD-10-CM

## 2016-08-07 LAB — POCT URINALYSIS DIPSTICK
BILIRUBIN UA: NEGATIVE
Blood, UA: NEGATIVE
GLUCOSE UA: NEGATIVE
KETONES UA: NEGATIVE
Nitrite, UA: NEGATIVE
PROTEIN UA: NEGATIVE
Urobilinogen, UA: NEGATIVE
pH, UA: 5

## 2016-08-07 LAB — URINALYSIS, MICROSCOPIC ONLY
Bacteria, UA: NONE SEEN [HPF]
Casts: NONE SEEN [LPF]
Crystals: NONE SEEN [HPF]
YEAST: NONE SEEN [HPF]

## 2016-08-07 NOTE — Patient Instructions (Signed)

## 2016-08-08 LAB — URINE CULTURE: ORGANISM ID, BACTERIA: NO GROWTH

## 2016-08-25 DIAGNOSIS — M79676 Pain in unspecified toe(s): Secondary | ICD-10-CM | POA: Diagnosis not present

## 2016-08-25 DIAGNOSIS — E038 Other specified hypothyroidism: Secondary | ICD-10-CM | POA: Diagnosis not present

## 2016-08-25 DIAGNOSIS — E784 Other hyperlipidemia: Secondary | ICD-10-CM | POA: Diagnosis not present

## 2016-11-05 DIAGNOSIS — H16292 Other keratoconjunctivitis, left eye: Secondary | ICD-10-CM | POA: Diagnosis not present

## 2016-11-11 DIAGNOSIS — H16292 Other keratoconjunctivitis, left eye: Secondary | ICD-10-CM | POA: Diagnosis not present

## 2016-11-26 DIAGNOSIS — Z23 Encounter for immunization: Secondary | ICD-10-CM | POA: Diagnosis not present

## 2016-11-26 DIAGNOSIS — E038 Other specified hypothyroidism: Secondary | ICD-10-CM | POA: Diagnosis not present

## 2016-11-26 DIAGNOSIS — E784 Other hyperlipidemia: Secondary | ICD-10-CM | POA: Diagnosis not present

## 2016-11-26 DIAGNOSIS — Z8249 Family history of ischemic heart disease and other diseases of the circulatory system: Secondary | ICD-10-CM | POA: Diagnosis not present

## 2016-11-26 DIAGNOSIS — Z0000 Encounter for general adult medical examination without abnormal findings: Secondary | ICD-10-CM | POA: Diagnosis not present

## 2016-11-26 DIAGNOSIS — Z1389 Encounter for screening for other disorder: Secondary | ICD-10-CM | POA: Diagnosis not present

## 2016-11-26 DIAGNOSIS — N390 Urinary tract infection, site not specified: Secondary | ICD-10-CM | POA: Diagnosis not present

## 2017-01-01 ENCOUNTER — Ambulatory Visit: Payer: 59

## 2017-01-08 ENCOUNTER — Ambulatory Visit: Payer: 59

## 2017-01-18 ENCOUNTER — Ambulatory Visit
Admission: RE | Admit: 2017-01-18 | Discharge: 2017-01-18 | Disposition: A | Discharge status: Home / Self Care | Payer: 59 | Source: Ambulatory Visit | Attending: Obstetrics and Gynecology | Admitting: Obstetrics and Gynecology

## 2017-01-18 DIAGNOSIS — Z1231 Encounter for screening mammogram for malignant neoplasm of breast: Secondary | ICD-10-CM | POA: Diagnosis not present

## 2017-04-10 DIAGNOSIS — R21 Rash and other nonspecific skin eruption: Secondary | ICD-10-CM | POA: Diagnosis not present

## 2017-04-10 DIAGNOSIS — L089 Local infection of the skin and subcutaneous tissue, unspecified: Secondary | ICD-10-CM | POA: Diagnosis not present

## 2017-04-10 DIAGNOSIS — B958 Unspecified staphylococcus as the cause of diseases classified elsewhere: Secondary | ICD-10-CM | POA: Diagnosis not present

## 2017-04-15 DIAGNOSIS — L958 Other vasculitis limited to the skin: Secondary | ICD-10-CM | POA: Diagnosis not present

## 2017-04-15 DIAGNOSIS — M310 Hypersensitivity angiitis: Secondary | ICD-10-CM | POA: Diagnosis not present

## 2017-04-29 DIAGNOSIS — L958 Other vasculitis limited to the skin: Secondary | ICD-10-CM | POA: Diagnosis not present

## 2017-07-05 DIAGNOSIS — L958 Other vasculitis limited to the skin: Secondary | ICD-10-CM | POA: Diagnosis not present

## 2017-07-28 DIAGNOSIS — L958 Other vasculitis limited to the skin: Secondary | ICD-10-CM | POA: Diagnosis not present

## 2017-08-11 DIAGNOSIS — L958 Other vasculitis limited to the skin: Secondary | ICD-10-CM | POA: Diagnosis not present

## 2017-08-12 DIAGNOSIS — M310 Hypersensitivity angiitis: Secondary | ICD-10-CM | POA: Diagnosis not present

## 2017-08-12 DIAGNOSIS — S81809A Unspecified open wound, unspecified lower leg, initial encounter: Secondary | ICD-10-CM | POA: Diagnosis not present

## 2017-08-16 DIAGNOSIS — L97921 Non-pressure chronic ulcer of unspecified part of left lower leg limited to breakdown of skin: Secondary | ICD-10-CM | POA: Diagnosis not present

## 2017-08-16 DIAGNOSIS — L97911 Non-pressure chronic ulcer of unspecified part of right lower leg limited to breakdown of skin: Secondary | ICD-10-CM | POA: Diagnosis not present

## 2017-08-16 DIAGNOSIS — L959 Vasculitis limited to the skin, unspecified: Secondary | ICD-10-CM | POA: Diagnosis not present

## 2017-08-16 DIAGNOSIS — Z5181 Encounter for therapeutic drug level monitoring: Secondary | ICD-10-CM | POA: Diagnosis not present

## 2017-08-16 DIAGNOSIS — M310 Hypersensitivity angiitis: Secondary | ICD-10-CM | POA: Diagnosis not present

## 2017-08-18 ENCOUNTER — Ambulatory Visit: Payer: 59 | Admitting: Obstetrics and Gynecology

## 2017-08-18 DIAGNOSIS — L97321 Non-pressure chronic ulcer of left ankle limited to breakdown of skin: Secondary | ICD-10-CM | POA: Diagnosis not present

## 2017-08-18 DIAGNOSIS — L959 Vasculitis limited to the skin, unspecified: Secondary | ICD-10-CM | POA: Diagnosis not present

## 2017-08-18 DIAGNOSIS — L97811 Non-pressure chronic ulcer of other part of right lower leg limited to breakdown of skin: Secondary | ICD-10-CM | POA: Diagnosis not present

## 2017-08-19 ENCOUNTER — Ambulatory Visit: Payer: 59 | Admitting: Obstetrics and Gynecology

## 2017-08-24 DIAGNOSIS — L97929 Non-pressure chronic ulcer of unspecified part of left lower leg with unspecified severity: Secondary | ICD-10-CM | POA: Diagnosis not present

## 2017-08-24 DIAGNOSIS — L97919 Non-pressure chronic ulcer of unspecified part of right lower leg with unspecified severity: Secondary | ICD-10-CM | POA: Diagnosis not present

## 2017-08-25 DIAGNOSIS — S81809D Unspecified open wound, unspecified lower leg, subsequent encounter: Secondary | ICD-10-CM | POA: Diagnosis not present

## 2017-08-25 DIAGNOSIS — S81801D Unspecified open wound, right lower leg, subsequent encounter: Secondary | ICD-10-CM | POA: Diagnosis not present

## 2017-08-25 DIAGNOSIS — S81802D Unspecified open wound, left lower leg, subsequent encounter: Secondary | ICD-10-CM | POA: Diagnosis not present

## 2017-08-25 DIAGNOSIS — L88 Pyoderma gangrenosum: Secondary | ICD-10-CM | POA: Diagnosis not present

## 2017-08-27 DIAGNOSIS — Z79899 Other long term (current) drug therapy: Secondary | ICD-10-CM | POA: Diagnosis not present

## 2017-09-01 ENCOUNTER — Ambulatory Visit: Payer: 59 | Admitting: Obstetrics and Gynecology

## 2017-09-02 DIAGNOSIS — S81809D Unspecified open wound, unspecified lower leg, subsequent encounter: Secondary | ICD-10-CM | POA: Diagnosis not present

## 2017-09-02 DIAGNOSIS — I872 Venous insufficiency (chronic) (peripheral): Secondary | ICD-10-CM | POA: Diagnosis not present

## 2017-09-09 DIAGNOSIS — L959 Vasculitis limited to the skin, unspecified: Secondary | ICD-10-CM | POA: Diagnosis not present

## 2017-09-09 DIAGNOSIS — I872 Venous insufficiency (chronic) (peripheral): Secondary | ICD-10-CM | POA: Diagnosis not present

## 2017-09-09 DIAGNOSIS — S81809D Unspecified open wound, unspecified lower leg, subsequent encounter: Secondary | ICD-10-CM | POA: Diagnosis not present

## 2017-09-15 DIAGNOSIS — S81809D Unspecified open wound, unspecified lower leg, subsequent encounter: Secondary | ICD-10-CM | POA: Diagnosis not present

## 2017-09-15 DIAGNOSIS — I872 Venous insufficiency (chronic) (peripheral): Secondary | ICD-10-CM | POA: Diagnosis not present

## 2017-09-17 DIAGNOSIS — M310 Hypersensitivity angiitis: Secondary | ICD-10-CM | POA: Diagnosis not present

## 2017-09-17 DIAGNOSIS — Z5181 Encounter for therapeutic drug level monitoring: Secondary | ICD-10-CM | POA: Diagnosis not present

## 2017-09-29 ENCOUNTER — Ambulatory Visit: Payer: 59 | Admitting: Obstetrics and Gynecology

## 2017-11-03 ENCOUNTER — Other Ambulatory Visit: Payer: Self-pay

## 2017-11-03 ENCOUNTER — Ambulatory Visit: Payer: 59 | Admitting: Obstetrics and Gynecology

## 2017-11-03 ENCOUNTER — Encounter: Payer: Self-pay | Admitting: Obstetrics and Gynecology

## 2017-11-03 VITALS — BP 114/70 | HR 88 | Resp 16 | Ht 66.0 in | Wt 222.0 lb

## 2017-11-03 DIAGNOSIS — Z01419 Encounter for gynecological examination (general) (routine) without abnormal findings: Secondary | ICD-10-CM | POA: Diagnosis not present

## 2017-11-03 NOTE — Progress Notes (Signed)
52 y.o. [id number] Married Caucasian female here for annual exam.    Reporting weight gain, nausea, diarrhea.  Also taking MTX.  Is on prednisone and tapering down.   Seeing dermatology and vascular specialist for her ulcerations of the skin on her legs.  Dx is vasculitis.   Good bladder control.   Promotion at work. Mother is not doing well - has dementia.   PCP: Dr. Link SnufferHolwerda    Patient's last menstrual period was 06/01/2002 (approximate).           Sexually active: Yes.    The current method of family planning is status post hysterectomy.    Exercising: No.  The patient does not participate in regular exercise at present. Smoker:  Former   Health Maintenance: Pap:  2010 Negative History of abnormal Pap:  no MMG:  01/18/17 BIRADS 1 negative/density b Colonoscopy:  2016 [place];next due 2021 BMD:   n/a  Result  n/a TDaP:  2010 Gardasil:   n/a HIV: negative in the past Hep C: never Screening Labs:  PCP   reports that she quit smoking about 8 years ago. Her smoking use included cigarettes. She quit after 20.00 years of use. She has never used smokeless tobacco. She reports that she drinks about 0.6 oz of alcohol per week. She reports that she does not use drugs.  Past Medical History:  Diagnosis Date  . Breast mass   . Childhood asthma   . Depression   . Dyspareunia   . Hx of adenomatous colonic polyps   . Hypercholesteremia   . Thyroid disease   . Vasculitis [facility])     Past Surgical History:  Procedure Laterality Date  . ABDOMINAL HYSTERECTOMY  2004   TVH--still has ovaries  . BLADDER SUSPENSION    . breast biopsy Left August 2016   fibroma  . BREAST BIOPSY    . BREAST SURGERY  12/2014   Benign Lt.breast bx  . CARPAL TUNNEL RELEASE Bilateral   . FOOT FRACTURE SURGERY Right 11/2013  . MOUTH SURGERY      Current Outpatient Medications  Medication Sig Dispense Refill  . albuterol (PROVENTIL HFA;VENTOLIN HFA) 108 (90 BASE) MCG/ACT  inhaler Inhale 2 puffs into the lungs. Takes prn    . Cholecalciferol (VITAMIN D3) 2000 UNITS capsule Take 2,000 Units by mouth daily.    . fexofenadine (ALLEGRA) 180 MG tablet Take 180 mg by mouth daily.    . folic acid (FOLVITE) 1 MG tablet Take 1 tablet by mouth daily.    Marland Kitchen. levothyroxine (SYNTHROID, LEVOTHROID) 50 MCG tablet Take 50 mcg by mouth daily.    . methotrexate (RHEUMATREX) 7.5 MG tablet Take 7.5 mg by mouth once a week. Caution" Chemotherapy. Protect from light.    . Nutritional Supplements (ESTROVEN WEIGHT MANAGEMENT PO) Take 1 tablet by mouth daily.     . predniSONE (DELTASONE) 5 MG tablet Take 5 mg by mouth daily.     No current facility-administered medications for this visit.     Family History  Problem Relation Age of Onset  . Diabetes Mother        borderline  . Hypertension Mother   . Thyroid disease Mother   . Heart failure Brother   . Hypertension Brother   . Heart failure Brother   . Pancreatic cancer Father   . Colon polyps Sister [id number]       precancer  . Asthma Brother   . Breast cancer Paternal Aunt [id number]  lumpectomy and survived    Review of Systems  Constitutional:       Weight gain  HENT: Negative.   Eyes: Negative.   Respiratory: Negative.   Cardiovascular: Negative.   Gastrointestinal: Positive for diarrhea and nausea.  Endocrine: Negative.   Genitourinary: Negative.   Musculoskeletal: Negative.   Skin: Negative.   Allergic/Immunologic: Negative.   Neurological: Negative.   Hematological: Negative.   Psychiatric/Behavioral: Negative.     Exam:   BP 114/70 (BP Location: Right Arm, Patient Position: Sitting, Cuff Size: Large)   Pulse 88   Resp 16   Ht 5\' 6"  (1.676 m)   Wt 222 lb (100.7 kg)   LMP 06/01/2002 (Approximate)   BMI 35.83 kg/m     General appearance: alert, cooperative and appears stated age Head: Normocephalic, without obvious abnormality, atraumatic Neck: no adenopathy, supple, symmetrical, trachea midline and thyroid  normal to inspection and palpation Lungs: clear to auscultation bilaterally Breasts: normal appearance, no masses or tenderness, No nipple retraction or dimpling, No nipple discharge or bleeding, No axillary or supraclavicular adenopathy Heart: regular rate and rhythm Abdomen: soft, non-tender; no masses, no organomegaly Extremities: extremities normal, atraumatic, no cyanosis or edema Skin: Skin color, texture, turgor normal. No rashes or lesions Lymph nodes: Cervical, supraclavicular, and axillary nodes normal. No abnormal inguinal nodes palpated Neurologic: Grossly normal  Pelvic: External genitalia:  no lesions              Urethra:  normal appearing urethra with no masses, tenderness or lesions              Bartholins and Skenes: normal                 Vagina: normal appearing vagina with normal color and discharge, no lesions.  Good support.               Cervix:  absent              Pap taken: No. Bimanual Exam:  Uterus:  absent              Adnexa: no mass, fullness, tenderness              Rectal exam: Yes.  .  Confirms.              Anus:  normal sphincter tone, no lesions  Chaperone was present for exam.  Assessment:   Well woman visit with normal exam. Status post TVH and probably midurethral sling. Still has ovaries. Hx left breast fibroma.  Vasculitis of LEs.   Plan: Mammogram screening. Recommended self breast awareness. Pap and HR HPV as above. Guidelines for Calcium, Vitamin D, regular exercise program including cardiovascular and weight bearing exercise. Labs with PCP.  Follow up annually and prn.   After visit summary provided.

## 2017-11-03 NOTE — Patient Instructions (Signed)

## 2017-12-01 DIAGNOSIS — Z5181 Encounter for therapeutic drug level monitoring: Secondary | ICD-10-CM | POA: Diagnosis not present

## 2017-12-01 DIAGNOSIS — M310 Hypersensitivity angiitis: Secondary | ICD-10-CM | POA: Diagnosis not present

## 2017-12-06 DIAGNOSIS — R82998 Other abnormal findings in urine: Secondary | ICD-10-CM | POA: Diagnosis not present

## 2017-12-06 DIAGNOSIS — E038 Other specified hypothyroidism: Secondary | ICD-10-CM | POA: Diagnosis not present

## 2017-12-06 DIAGNOSIS — Z0000 Encounter for general adult medical examination without abnormal findings: Secondary | ICD-10-CM | POA: Diagnosis not present

## 2017-12-13 DIAGNOSIS — Z79899 Other long term (current) drug therapy: Secondary | ICD-10-CM | POA: Diagnosis not present

## 2017-12-13 DIAGNOSIS — Z0000 Encounter for general adult medical examination without abnormal findings: Secondary | ICD-10-CM | POA: Diagnosis not present

## 2017-12-13 DIAGNOSIS — Z1389 Encounter for screening for other disorder: Secondary | ICD-10-CM | POA: Diagnosis not present

## 2017-12-13 DIAGNOSIS — E038 Other specified hypothyroidism: Secondary | ICD-10-CM | POA: Diagnosis not present

## 2017-12-14 DIAGNOSIS — Z1212 Encounter for screening for malignant neoplasm of rectum: Secondary | ICD-10-CM | POA: Diagnosis not present

## 2018-01-03 ENCOUNTER — Other Ambulatory Visit: Payer: Self-pay | Admitting: Obstetrics and Gynecology

## 2018-01-03 DIAGNOSIS — Z1231 Encounter for screening mammogram for malignant neoplasm of breast: Secondary | ICD-10-CM

## 2018-01-27 DIAGNOSIS — H35371 Puckering of macula, right eye: Secondary | ICD-10-CM | POA: Diagnosis not present

## 2018-01-27 DIAGNOSIS — H538 Other visual disturbances: Secondary | ICD-10-CM | POA: Diagnosis not present

## 2018-01-27 DIAGNOSIS — H43393 Other vitreous opacities, bilateral: Secondary | ICD-10-CM | POA: Diagnosis not present

## 2018-01-28 ENCOUNTER — Ambulatory Visit
Admission: RE | Admit: 2018-01-28 | Discharge: 2018-01-28 | Disposition: A | Discharge status: Home / Self Care | Payer: 59 | Source: Ambulatory Visit | Attending: Obstetrics and Gynecology | Admitting: Obstetrics and Gynecology

## 2018-01-28 DIAGNOSIS — Z1231 Encounter for screening mammogram for malignant neoplasm of breast: Secondary | ICD-10-CM | POA: Diagnosis not present

## 2018-04-08 ENCOUNTER — Telehealth: Payer: Self-pay | Admitting: Obstetrics and Gynecology

## 2018-04-08 NOTE — Telephone Encounter (Signed)
Patient will check work schedule and reschedule cancelled appointment.

## 2018-04-12 DIAGNOSIS — Z5181 Encounter for therapeutic drug level monitoring: Secondary | ICD-10-CM | POA: Diagnosis not present

## 2018-04-12 DIAGNOSIS — L905 Scar conditions and fibrosis of skin: Secondary | ICD-10-CM | POA: Diagnosis not present

## 2018-04-12 DIAGNOSIS — S81809A Unspecified open wound, unspecified lower leg, initial encounter: Secondary | ICD-10-CM | POA: Diagnosis not present

## 2018-04-12 DIAGNOSIS — M310 Hypersensitivity angiitis: Secondary | ICD-10-CM | POA: Diagnosis not present

## 2018-04-12 DIAGNOSIS — L810 Postinflammatory hyperpigmentation: Secondary | ICD-10-CM | POA: Diagnosis not present

## 2018-04-20 DIAGNOSIS — Z79899 Other long term (current) drug therapy: Secondary | ICD-10-CM | POA: Diagnosis not present

## 2018-04-20 DIAGNOSIS — E7849 Other hyperlipidemia: Secondary | ICD-10-CM | POA: Diagnosis not present

## 2018-10-13 DIAGNOSIS — H35371 Puckering of macula, right eye: Secondary | ICD-10-CM | POA: Diagnosis not present

## 2018-10-13 DIAGNOSIS — H43393 Other vitreous opacities, bilateral: Secondary | ICD-10-CM | POA: Diagnosis not present

## 2018-12-08 ENCOUNTER — Ambulatory Visit: Payer: 59 | Admitting: Obstetrics and Gynecology

## 2018-12-09 ENCOUNTER — Ambulatory Visit: Payer: 59 | Admitting: Obstetrics and Gynecology

## 2018-12-13 ENCOUNTER — Other Ambulatory Visit: Payer: Self-pay | Admitting: Obstetrics and Gynecology

## 2018-12-13 ENCOUNTER — Other Ambulatory Visit: Payer: Self-pay

## 2018-12-13 DIAGNOSIS — Z1231 Encounter for screening mammogram for malignant neoplasm of breast: Secondary | ICD-10-CM

## 2018-12-15 ENCOUNTER — Ambulatory Visit: Payer: 59 | Admitting: Obstetrics and Gynecology

## 2018-12-15 ENCOUNTER — Other Ambulatory Visit: Payer: Self-pay

## 2018-12-15 ENCOUNTER — Encounter: Payer: Self-pay | Admitting: Obstetrics and Gynecology

## 2018-12-15 VITALS — BP 148/76 | HR 84 | Temp 98.6°F | Resp 14 | Ht 66.0 in | Wt 205.0 lb

## 2018-12-15 DIAGNOSIS — Z01419 Encounter for gynecological examination (general) (routine) without abnormal findings: Secondary | ICD-10-CM

## 2018-12-15 DIAGNOSIS — Z23 Encounter for immunization: Secondary | ICD-10-CM | POA: Diagnosis not present

## 2018-12-15 NOTE — Patient Instructions (Signed)

## 2018-12-15 NOTE — Progress Notes (Signed)
53 y.o. [id number] Married Caucasian female here for annual exam.    Thought she had a left breast mass, which she cannot feel anymore.  No pain.   Some stress incontinence if her bladder is full and does a laugh, sneeze, cough.   Several family members have tested positive for Covid 19.  Aunt died from Covid 7119.   Labs with PCP.  She will see him tomorrow.  PCP: Alysia PennaScott Holwerda, MD     Patient's last menstrual period was 06/01/2002 (approximate).           Sexually active: Yes.    The current method of family planning is status post hysterectomy -- ovaries remain.    Exercising: No.  The patient does not participate in regular exercise at present. Smoker:  Former  Health Maintenance: Pap:  2010 Negative History of abnormal Pap:  no MMG:  01/28/18 BIRADS 1 negative/density b -- scheduled 01/31/19 [place]  [place] 2021 TDaP:  2010 HIV: negative in the past Hep C: never Screening Labs:  PCP   reports that she quit smoking about 9 years ago. Her smoking use included cigarettes. She quit after 20.00 years of use. She has never used smokeless tobacco. She reports current alcohol use of about 1.0 standard drinks of alcohol per week. She reports that she does not use drugs.  Past Medical History:  Diagnosis Date  . Breast mass   . Childhood asthma   . Depression   . Dyspareunia   . Hx of adenomatous colonic polyps   . Hypercholesteremia   . Thyroid disease   . Vasculitis [facility](HCC)     Past Surgical History:  Procedure Laterality Date  . ABDOMINAL HYSTERECTOMY  2004   TVH--still has ovaries  . BLADDER SUSPENSION    . breast biopsy Left August 2016   fibroma  . BREAST BIOPSY Left    benign  . BREAST SURGERY  12/2014   Benign Lt.breast bx  . CARPAL TUNNEL RELEASE Bilateral   . FOOT FRACTURE SURGERY Right 11/2013  . MOUTH SURGERY      Current Outpatient Medications  Medication Sig Dispense Refill  . albuterol (PROVENTIL HFA;VENTOLIN  HFA) 108 (90 BASE) MCG/ACT inhaler Inhale 2 puffs into the lungs. Takes prn    . Cholecalciferol (VITAMIN D3) 2000 UNITS capsule Take 2,000 Units by mouth daily.    Marland Kitchen. levothyroxine (SYNTHROID, LEVOTHROID) 50 MCG tablet Take 50 mcg by mouth daily.    . Naltrexone-buPROPion HCl ER 8-90 MG TB12 Take 2 tablets by mouth 2 (two) times daily.     No current facility-administered medications for this visit.     Family History  Problem Relation Age of Onset  . Diabetes Mother        borderline  . Hypertension Mother   . Thyroid disease Mother   . Heart failure Brother   . Hypertension Brother   . Heart failure Brother   . Pancreatic cancer Father   . Colon polyps Sister [id number]       precancer  . Asthma Brother   . Breast cancer Paternal Aunt [id number]       lumpectomy and survived    Review of Systems  Constitutional: Negative.   HENT: Negative.   Eyes: Negative.   Respiratory: Negative.   Cardiovascular: Negative.   Gastrointestinal: Negative.   Endocrine: Negative.   Genitourinary: Negative.   Musculoskeletal: Negative.   Skin: Negative.   Allergic/Immunologic: Negative.   Neurological: Negative.  Hematological: Negative.   Psychiatric/Behavioral: Negative.     Exam:   BP (!) 148/76 (BP Location: Right Arm, Patient Position: Sitting, Cuff Size: Normal)   Pulse 84   Temp 98.6 F (37 C) (Temporal)   Resp 14   Ht 5\' 6"  (1.676 m)   Wt 205 lb (93 kg)   LMP 06/01/2002 (Approximate)   BMI 33.09 kg/m     General appearance: alert, cooperative and appears stated age Head: normocephalic, without obvious abnormality, atraumatic Neck: no adenopathy, supple, symmetrical, trachea midline and thyroid normal to inspection and palpation Lungs: clear to auscultation bilaterally Breasts: normal appearance, no masses or tenderness, No nipple retraction or dimpling, No nipple discharge or bleeding, No axillary adenopathy Heart: regular rate and rhythm Abdomen: soft, non-tender; no masses, no  organomegaly Extremities: extremities normal, atraumatic, no cyanosis or edema Skin: skin color, texture, turgor normal. Patches of nonraised erythema of LE. Lymph nodes: cervical, supraclavicular, and axillary nodes normal. Neurologic: grossly normal  Pelvic: External genitalia:  no lesions              No abnormal inguinal nodes palpated.              Urethra:  normal appearing urethra with no masses, tenderness or lesions              Bartholins and Skenes: normal                 Vagina: normal appearing vagina with normal color and discharge, no lesions              Cervix: absent              Pap taken: No. Bimanual Exam:  Uterus:   absent              Adnexa: no mass, fullness, tenderness              Rectal exam: Yes.  .  Confirms.              Anus:  normal sphincter tone, no lesions  Chaperone was present for exam.  Assessment:   Well woman visit with normal exam. Status post TVH and probably midurethral sling. Still has ovaries. Mild stress incontinence. Hx left breast fibroma.Normal breast exam today.  Vasculitis of LEs.  Status post MTX and prednisone.   Plan: Mammogram screening discussed. Self breast awareness reviewed. Pap and HR HPV as above. Guidelines for Calcium, Vitamin D, regular exercise program including cardiovascular and weight bearing exercise. We discussed further evaluation and tx for stress incontinence if desires.  She declines.  TDap. Follow up annually and prn.   After visit summary provided.

## 2019-01-31 ENCOUNTER — Ambulatory Visit: Payer: 59

## 2019-03-09 ENCOUNTER — Ambulatory Visit: Payer: 59

## 2019-03-29 ENCOUNTER — Ambulatory Visit
Admission: RE | Admit: 2019-03-29 | Discharge: 2019-03-29 | Disposition: A | Discharge status: Home / Self Care | Payer: 59 | Source: Ambulatory Visit | Attending: Obstetrics and Gynecology | Admitting: Obstetrics and Gynecology

## 2019-03-29 ENCOUNTER — Other Ambulatory Visit: Payer: Self-pay

## 2019-03-29 DIAGNOSIS — Z1231 Encounter for screening mammogram for malignant neoplasm of breast: Secondary | ICD-10-CM

## 2019-06-29 ENCOUNTER — Encounter: Payer: Self-pay | Admitting: Gastroenterology

## 2019-07-17 ENCOUNTER — Other Ambulatory Visit: Payer: Self-pay

## 2019-07-17 ENCOUNTER — Ambulatory Visit (AMBULATORY_SURGERY_CENTER): Payer: 59 | Admitting: *Deleted

## 2019-07-17 VITALS — Temp 98.0°F | Ht 66.0 in | Wt 211.6 lb

## 2019-07-17 DIAGNOSIS — Z86010 Personal history of colonic polyps: Secondary | ICD-10-CM

## 2019-07-17 DIAGNOSIS — Z01818 Encounter for other preprocedural examination: Secondary | ICD-10-CM

## 2019-07-17 MED ORDER — SUPREP BOWEL PREP KIT 17.5-3.13-1.6 GM/177ML PO SOLN: 1.0000 | Freq: Once | ORAL | 0 refills | Status: AC

## 2019-07-17 NOTE — Progress Notes (Signed)
Patient denies any allergies to egg or soy products. Patient denies complications with anesthesia/sedation.  Patient denies oxygen use at home and denies diet medications. Emmi instructions for colonoscopy/endoscopy explained and given to patient.   

## 2019-07-31 ENCOUNTER — Encounter: Payer: Self-pay | Admitting: Gastroenterology

## 2019-08-01 ENCOUNTER — Other Ambulatory Visit: Payer: Self-pay

## 2019-08-02 ENCOUNTER — Ambulatory Visit (INDEPENDENT_AMBULATORY_CARE_PROVIDER_SITE_OTHER): Payer: 59

## 2019-08-02 ENCOUNTER — Other Ambulatory Visit: Payer: Self-pay | Admitting: Gastroenterology

## 2019-08-02 DIAGNOSIS — Z1159 Encounter for screening for other viral diseases: Secondary | ICD-10-CM

## 2019-08-03 LAB — SARS CORONAVIRUS 2 (TAT 6-24 HRS): SARS Coronavirus 2: NEGATIVE

## 2019-08-04 ENCOUNTER — Encounter: Payer: Self-pay | Admitting: Gastroenterology

## 2019-08-04 ENCOUNTER — Ambulatory Visit (AMBULATORY_SURGERY_CENTER): Payer: 59 | Admitting: Gastroenterology

## 2019-08-04 ENCOUNTER — Other Ambulatory Visit: Payer: Self-pay

## 2019-08-04 VITALS — BP 131/78 | HR 65 | Temp 96.8°F | Resp 12 | Ht 66.0 in | Wt 211.6 lb

## 2019-08-04 DIAGNOSIS — Z86010 Personal history of colonic polyps: Secondary | ICD-10-CM | POA: Diagnosis present

## 2019-08-04 MED ORDER — SODIUM CHLORIDE 0.9 % IV SOLN: 500.0000 mL | Freq: Once | INTRAVENOUS | Status: DC

## 2019-08-04 NOTE — Op Note (Signed)
[place] Patient Name: Holly Mcdaniel Procedure Date: 08/04/2019 8:31 AM MRN: [id number] Endoscopist: Thornton Park MD, MD Age: 54 Referring MD:  Date of Birth: 1965/08/24 Gender: Female Account #: [id number] Procedure:                Colonoscopy Indications:              High risk colon cancer surveillance: Personal                            history of colonic polyp                           Colonoscopy in [name] 10/29/2013 - patient                            reports history of polyps and was told to have a                            surveillance colonoscopy in 3-5 years                           No known family history of colon cancer or polyps Medicines:                Monitored Anesthesia Care Procedure:                Pre-Anesthesia Assessment:                           - Prior to the procedure, a History and Physical                            was performed, and patient medications and                            allergies were reviewed. The patient's tolerance of                            previous anesthesia was also reviewed. The risks                            and benefits of the procedure and the sedation                            options and risks were discussed with the patient.                            All questions were answered, and informed consent                            was obtained. Prior Anticoagulants: The patient has                            taken no previous anticoagulant or antiplatelet  agents. ASA Grade Assessment: II - A patient with                            mild systemic disease. After reviewing the risks                            and benefits, the patient was deemed in                            satisfactory condition to undergo the procedure.                           After obtaining informed consent, the colonoscope                            was passed under direct vision. Throughout the                       procedure, the patient's blood pressure, pulse, and                            oxygen saturations were monitored continuously. The                            Colonoscope was introduced through the anus and                            advanced to the 2 cm into the ileum. A second                            forward view of the right colon was performed. The                            colonoscopy was performed without difficulty. The                            patient tolerated the procedure well. The quality                            of the bowel preparation was adequate. There was                            extensive gritty and liquid residue that could be                            aspirated and lavaged during the proceduring. The                            ileocecal valve, appendiceal orifice, and rectum                            were photographed. Scope In: 8:37:48 AM Scope Out: 8:54:28 AM Scope Withdrawal Time: 0 hours 11 minutes 53 seconds  Total Procedure Duration: 0  hours 16 minutes 40 seconds  Findings:                 The perianal and digital rectal examinations were                            normal.                           Non-bleeding internal hemorrhoids were found. The                            hemorrhoids were small.                           The exam was otherwise without abnormality on                            direct and retroflexion views. Complications:            No immediate complications. Estimated Blood Loss:     Estimated blood loss: none. Impression:               - Non-bleeding internal hemorrhoids.                           - The examination was otherwise normal on direct                            and retroflexion views.                           - No specimens collected. Recommendation:           - Patient has a contact number available for                            emergencies. The signs and symptoms of potential                             delayed complications were discussed with the                            patient. Return to normal activities tomorrow.                            Written discharge instructions were provided to the                            patient.                           - Resume previous diet.                           - Continue present medications.                           - Repeat colonoscopy in 5 years for surveillance if  there is a history of adenomas. Otherwise, plan                            colonoscopy in 10 years. Use a two day prep at that                            time.                           - Obtain prior colonoscopy report from [facility].                           - Merging evidence supports eating a diet of                            fruits, vegetabnles, grains, calcium, and yogurt                            while reducing red meat and alcohol may reduce the                            risk of colon cancer.                           - Thank you for allowing me to be involved in your                            colon cancer prevention. Tressia Danas MD, MD 08/04/2019 9:03:45 AM This report has been signed electronically.

## 2019-08-04 NOTE — Patient Instructions (Signed)
? ?  Handout on hemorrhoids given to you today ? ? ? ?YOU HAD AN ENDOSCOPIC PROCEDURE TODAY AT THE [facility]:   Refer to the procedure report that was given to you for any specific questions about what was found during the examination.  If the procedure report does not answer your questions, please call your gastroenterologist to clarify.  If you requested that your care partner not be given the details of your procedure findings, then the procedure report has been included in a sealed envelope for you to review at your convenience later. ? ?YOU SHOULD EXPECT: Some feelings of bloating in the abdomen. Passage of more gas than usual.  Walking can help get rid of the air that was put into your GI tract during the procedure and reduce the bloating. If you had a lower endoscopy (such as a colonoscopy or flexible sigmoidoscopy) you may notice spotting of blood in your stool or on the toilet paper. If you underwent a bowel prep for your procedure, you may not have a normal bowel movement for a few days. ? ?Please Note:  You might notice some irritation and congestion in your nose or some drainage.  This is from the oxygen used during your procedure.  There is no need for concern and it should clear up in a day or so. ? ?SYMPTOMS TO REPORT IMMEDIATELY: ? ?Following lower endoscopy (colonoscopy or flexible sigmoidoscopy): ? Excessive amounts of blood in the stool ? Significant tenderness or worsening of abdominal pains ? Swelling of the abdomen that is new, acute ? Fever of 100?F or higher ? ?For urgent or emergent issues, a gastroenterologist can be reached at any hour by calling [phone]. ?Do not use MyChart messaging for urgent concerns.  ? ? ?DIET:  We do recommend a small meal at first, but then you may proceed to your regular diet.  Drink plenty of fluids but you should avoid alcoholic beverages for 24 hours. ? ?ACTIVITY:  You should plan to take it easy for the rest of today and you should NOT  DRIVE or use heavy machinery until tomorrow (because of the sedation medicines used during the test).   ? ?FOLLOW UP: ?Our staff will call the number listed on your records 48-72 hours following your procedure to check on you and address any questions or concerns that you may have regarding the information given to you following your procedure. If we do not reach you, we will leave a message.  We will attempt to reach you two times.  During this call, we will ask if you have developed any symptoms of COVID 19. If you develop any symptoms (ie: fever, flu-like symptoms, shortness of breath, cough etc.) before then, please call [phone].  If you test positive for Covid 19 in the 2 weeks post procedure, please call and report this information to us.   ? ?If any biopsies were taken you will be contacted by phone or by letter within the next 1-3 weeks.  Please call us at [phone] if you have not heard about the biopsies in 3 weeks.  ? ? ?SIGNATURES/CONFIDENTIALITY: ?You and/or your care partner have signed paperwork which will be entered into your electronic medical record.  These signatures attest to the fact that that the information above on your After Visit Summary has been reviewed and is understood.  Full responsibility of the confidentiality of this discharge information lies with you and/or your care-partner.  ?

## 2019-08-04 NOTE — Progress Notes (Signed)
Pt's states no medical or surgical changes since previsit or office visit.  Temp JB VS CW 

## 2019-08-04 NOTE — Progress Notes (Signed)
PT taken to PACU. Monitors in place. VSS. Report given to RN. 

## 2019-08-07 ENCOUNTER — Telehealth: Payer: Self-pay | Admitting: Gastroenterology

## 2019-08-07 NOTE — Telephone Encounter (Signed)
[facility] HIM Dept faxed request for medical records to [facility] 08/07/19  [facility]

## 2019-08-08 ENCOUNTER — Telehealth: Payer: Self-pay | Admitting: *Deleted

## 2019-08-08 NOTE — Telephone Encounter (Signed)
  Follow up Call-  Call back number 08/04/2019  Post procedure Call Back phone  # [phone]  Permission to leave phone message Yes  Some recent data might be hidden     Patient questions:  Do you have a fever, pain , or abdominal swelling? No. Pain Score  0 *  Have you tolerated food without any problems? Yes.    Have you been able to return to your normal activities? Yes.    Do you have any questions about your discharge instructions: Diet   No. Medications  No. Follow up visit  No.  Do you have questions or concerns about your Care? No.  Actions: * If pain score is 4 or above: No action needed, pain <4.  1. Have you developed a fever since your procedure? no  2.   Have you had an respiratory symptoms (SOB or cough) since your procedure? no  3.   Have you tested positive for COVID 19 since your procedure no  4.   Have you had any family members/close contacts diagnosed with the COVID 19 since your procedure?  no   If yes to any of these questions please route to Laverna Peace, RN and Jennye Boroughs, Charity fundraiser.

## 2019-10-17 ENCOUNTER — Telehealth: Payer: Self-pay

## 2019-10-17 NOTE — Telephone Encounter (Signed)
Patient is calling in regards to having consistent light bleeding for about 3-4 weeks. Patient states she had a hysterectomy a few years ago. Patient also stated she has a feeling of being bloated since the bleeding started.

## 2019-10-17 NOTE — Telephone Encounter (Signed)
AEX 11/2018. Next scheduled 11/2019 Taking methotrexate and folic acid and topical cream for Vasculitis from 1st Covid vaccine 07/2019. Hysterectomy 2004 TVH, no HRT Mild stress incontinence.   Spoke to pt. Pt reports having light pink spotting that occurred in last 4 weeks and has continued. Pt thought at first was post coital, but now having to use panty liner. Pt only changes panty liner as needed. Pt denies heavy bleeding or clots. Pt does having vaginal discharge and odor and some abd bloating  that she noticed in last 2 weeks. Denies vaginal itching or irritation. Pt states has been more SA. Denies vaginal dryness. No h/o vaginal atrophy.  Advised pt to have OV for further evaluation. Pt agreeable. Pt scheduled for OV with Dr Edward Jolly on 10/19/19 at 130 pm. Pt agreeable and verbalized understanding of date and time of appt.  Routing to Dr Edward Jolly for review.  Encounter closed.

## 2019-10-18 ENCOUNTER — Other Ambulatory Visit: Payer: Self-pay

## 2019-10-18 NOTE — Progress Notes (Signed)
GYNECOLOGY  VISIT   HPI: 54 y.o.   Married  Caucasian  female   [phone] with Patient's last menstrual period was 06/01/2002 (lmp unknown).   here for vaginal spotting with odor and bloating.  Symptoms of vaginal spotting, first appeared one month ago.  Having infrequent intercourse.  Not using anything in her vagina. No pain.   Yesterday, she noted pink drainage with wiping.  No itching.   Some bloating.  Regular BMs, but not for the last couple of days.  No big dietary change.   Denies dysuria and blood in her urine.   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  Her current outbreak occurred after her first Covid vaccine.  She sees Dr. Marisa Hua at [facility].   Patient denies hx of superficial or deep vein thrombosis.   Stressful several months:  Work and daughter's new dx of thyroid cancer.   GYNECOLOGIC HISTORY: Patient's last menstrual period was 06/01/2002 (lmp unknown). Contraception:  Hyst--ovaries remain Menopausal hormone therapy: none Last mammogram: 03-29-19 neg/density B/BiRads1 Last pap smear:   2010 Negative        OB History    Gravida  3   Para  3   Term  3   Preterm      AB      Living  3     SAB      TAB      Ectopic      Multiple      Live Births                 There are no problems to display for this patient.   Past Medical History:  Diagnosis Date  . Allergy   . Anxiety   . Breast mass   . Childhood asthma    no problems as an adult  . Depression   . Dyspareunia   . Hx of adenomatous colonic polyps   . Hypercholesteremia   . Thyroid disease   . Vasculitis [facility])     Past Surgical History:  Procedure Laterality Date  . ABDOMINAL HYSTERECTOMY  2004   TVH--still has ovaries  . BLADDER SUSPENSION    . breast biopsy Left August 2016   fibroma  . BREAST BIOPSY Left    benign  . BREAST SURGERY  12/2014   Benign Lt.breast bx  . CARPAL TUNNEL RELEASE Bilateral 2017  .  CHOLECYSTECTOMY  2015  . COLONOSCOPY  02/11/2015   [facility] Dr Caryl Never - hx polyps  . FOOT FRACTURE SURGERY Right 11/2013  . MOUTH SURGERY     gum graft x 4  . REFRACTIVE SURGERY      Current Outpatient Medications  Medication Sig Dispense Refill  . albuterol (PROVENTIL HFA;VENTOLIN HFA) 108 (90 BASE) MCG/ACT inhaler Inhale 2 puffs into the lungs. Takes prn    . Cholecalciferol (VITAMIN D3) 2000 UNITS capsule Take 2,000 Units by mouth daily.    . divalproex (DEPAKOTE) 125 MG DR tablet Take 125 mg by mouth 2 (two) times daily.    . fexofenadine (ALLEGRA) 180 MG tablet Take 180 mg by mouth daily.    . folic acid (FOLVITE) 1 MG tablet Take by mouth.    . levothyroxine (SYNTHROID, LEVOTHROID) 50 MCG tablet Take 50 mcg by mouth daily.    . methotrexate (RHEUMATREX) 2.5 MG tablet Take 10 mg by mouth once a week.    . rosuvastatin (CRESTOR) 20 MG tablet Take 20 mg by mouth at bedtime.    Marland Kitchen  silver sulfADIAZINE (SILVADENE) 1 % cream     . timolol (TIMOPTIC) 0.5 % ophthalmic solution Apply 1 drop on the wound twice daily     No current facility-administered medications for this visit.     ALLERGIES: Erythromycin and Venlafaxine  Family History  Problem Relation Age of Onset  . Diabetes Mother        borderline  . Hypertension Mother   . Thyroid disease Mother   . Heart failure Brother   . Hypertension Brother   . Heart failure Brother   . Pancreatic cancer Father   . Colon polyps Sister 63       precancer  . Asthma Brother   . Breast cancer Paternal Aunt 26       lumpectomy and survived  . Colon cancer Neg Hx   . Rectal cancer Neg Hx   . Stomach cancer Neg Hx     Social History   Socioeconomic History  . Marital status: Married    Spouse name: Not on file  . Number of children: Not on file  . Years of education: Not on file  . Highest education level: Not on file  Occupational History  . Not on file  Tobacco Use  . Smoking status: Former Smoker    Years: 20.00    Types:  Cigarettes    Quit date: 06/01/2009    Years since quitting: 10.3  . Smokeless tobacco: Never Used  . Tobacco comment: socially  Substance and Sexual Activity  . Alcohol use: Yes    Alcohol/week: 8.0 - 15.0 standard drinks    Types: 7 - 14 Glasses of wine, 1 Standard drinks or equivalent per week  . Drug use: No  . Sexual activity: Yes    Partners: Male    Birth control/protection: Surgical    Comment: hysterectomy  Other Topics Concern  . Not on file  Social History Narrative  . Not on file   Social Determinants of Health   Financial Resource Strain:   . Difficulty of Paying Living Expenses:   Food Insecurity:   . Worried About Programme researcher, broadcasting/film/video in the Last Year:   . Barista in the Last Year:   Transportation Needs:   . Freight forwarder (Medical):   Marland Kitchen Lack of Transportation (Non-Medical):   Physical Activity:   . Days of Exercise per Week:   . Minutes of Exercise per Session:   Stress:   . Feeling of Stress :   Social Connections:   . Frequency of Communication with Friends and Family:   . Frequency of Social Gatherings with Friends and Family:   . Attends Religious Services:   . Active Member of Clubs or Organizations:   . Attends Banker Meetings:   Marland Kitchen Marital Status:   Intimate Partner Violence:   . Fear of Current or Ex-Partner:   . Emotionally Abused:   Marland Kitchen Physically Abused:   . Sexually Abused:     Review of Systems  All other systems reviewed and are negative.   PHYSICAL EXAMINATION:    BP 110/72 (Cuff Size: Large)   Pulse 80   Temp (!) 97.4 F (36.3 C) (Temporal)   Ht 5\' 6"  (1.676 m)   Wt 219 lb 9.6 oz (99.6 kg)   LMP 06/01/2002 (LMP Unknown)   BMI 35.44 kg/m     General appearance: alert, cooperative and appears stated age   Abdomen: soft, non-tender, no masses,  no organomegaly  Pelvic: External genitalia:  no lesions              Urethra:  normal appearing urethra with no masses, tenderness or lesions               Bartholins and Skenes: normal                 Vagina: normal appearing vagina with erythema and red/brown discharge, no lesions              Cervix:  Absent.                 Bimanual Exam:  Uterus:  absent              Adnexa: no mass, fullness, tenderness              Rectal exam: Yes.  .  Confirms.              Anus:  normal sphincter tone, no lesions  Chaperone was present for exam.  ASSESSMENT  Status post TVH and probably midurethral sling. Still has ovaries. Vasculitis of LEs. Status post MTX and prednisone. Abdominal bloating.   PLAN  We discussed forms of vaginitis including infection and atrophy. Affirm taken.  Will reach out to patient's dermatologist to see about using vaginal estrogen as the patient is interested in this option.  Return for pelvic US.   An After Visit Summary was printed and given to the patient.  __20____ minutes face to face time of which over 50% was spent in counseling.

## 2019-10-19 ENCOUNTER — Telehealth: Payer: Self-pay | Admitting: Obstetrics and Gynecology

## 2019-10-19 ENCOUNTER — Encounter: Payer: Self-pay | Admitting: Obstetrics and Gynecology

## 2019-10-19 ENCOUNTER — Ambulatory Visit: Payer: 59 | Admitting: Obstetrics and Gynecology

## 2019-10-19 VITALS — BP 110/72 | HR 80 | Temp 97.4°F | Ht 66.0 in | Wt 219.6 lb

## 2019-10-19 DIAGNOSIS — N760 Acute vaginitis: Secondary | ICD-10-CM

## 2019-10-19 DIAGNOSIS — R140 Abdominal distension (gaseous): Secondary | ICD-10-CM

## 2019-10-19 NOTE — Telephone Encounter (Signed)
Please reach out to patient's dermatology office at [facility]:  Dr. Marisa Hua, who is treating the patient's vasculitis in her legs.   I would like to start the patient on vaginal estrogen cream for vaginal atrophy.  This is not considered estrogen replacement therapy.   Does Dr. Marisa Hua have concerns about this medication with respect to the patient's vasculitis?

## 2019-10-19 NOTE — Telephone Encounter (Signed)
Spoke with Vikki Ports at [facility]. Advised as seen below per Dr. Edward Jolly. She will review with Dr. Marisa Hua and return call to office.

## 2019-10-19 NOTE — Patient Instructions (Signed)
Atrophic Vaginitis  Atrophic vaginitis is a condition in which the tissues that line the vagina become dry and thin. This condition is most common in women who have stopped having regular menstrual periods (are in menopause). This usually starts when a woman is 45-55 years old. That is the time when a woman's estrogen levels begin to drop (decrease). Estrogen is a female hormone. It helps to keep the tissues of the vagina moist. It stimulates the vagina to produce a clear fluid that lubricates the vagina for sexual intercourse. This fluid also protects the vagina from infection. Lack of estrogen can cause the lining of the vagina to get thinner and dryer. The vagina may also shrink in size. It may become less elastic. Atrophic vaginitis tends to get worse over time as a woman's estrogen level drops. What are the causes? This condition is caused by the normal drop in estrogen that happens around the time of menopause. What increases the risk? Certain conditions or situations may lower a woman's estrogen level, leading to a higher risk for atrophic vaginitis. You are more likely to develop this condition if:  You are taking medicines that block estrogen.  You have had your ovaries removed.  You are being treated for cancer with X-ray (radiation) or medicines (chemotherapy).  You have given birth or are breastfeeding.  You are older than age 50.  You smoke. What are the signs or symptoms? Symptoms of this condition include:  Pain, soreness, or bleeding during sexual intercourse (dyspareunia).  Vaginal burning, irritation, or itching.  Pain or bleeding when a speculum is used in a vaginal exam (pelvic exam).  Having burning pain when passing urine.  Vaginal discharge that is brown or yellow. In some cases, there are no symptoms. How is this diagnosed? This condition is diagnosed by taking a medical history and doing a physical exam. This will include a pelvic exam that checks the  vaginal tissues. Though rare, you may also have other tests, including:  A urine test.  A test that checks the acid balance in your vagina (acid balance test). How is this treated? Treatment for this condition depends on how severe your symptoms are. Treatment may include:  Using an over-the-counter vaginal lubricant before sex.  Using a long-acting vaginal moisturizer.  Using low-dose vaginal estrogen for moderate to severe symptoms that do not respond to other treatments. Options include creams, tablets, and inserts (vaginal rings). Before you use a vaginal estrogen, tell your health care provider if you have a history of: ? Breast cancer. ? Endometrial cancer. ? Blood clots. If you are not sexually active and your symptoms are very mild, you may not need treatment. Follow these instructions at home: Medicines  Take over-the-counter and prescription medicines only as told by your health care provider. Do not use herbal or alternative medicines unless your health care provider says that you can.  Use over-the-counter creams, lubricants, or moisturizers for dryness only as directed by your health care provider. General instructions  If your atrophic vaginitis is caused by menopause, discuss all of your menopause symptoms and treatment options with your health care provider.  Do not douche.  Do not use products that can make your vagina dry. These include: ? Scented feminine sprays. ? Scented tampons. ? Scented soaps.  Vaginal intercourse can help to improve blood flow and elasticity of vaginal tissue. If it hurts to have sex, try using a lubricant or moisturizer just before having intercourse. Contact a health care provider if:    Your discharge looks different than normal.  Your vagina has an unusual smell.  You have new symptoms.  Your symptoms do not improve with treatment.  Your symptoms get worse. Summary  Atrophic vaginitis is a condition in which the tissues that  line the vagina become dry and thin. It is most common in women who have stopped having regular menstrual periods (are in menopause).  Treatment options include using vaginal lubricants and low-dose vaginal estrogen.  Contact a health care provider if your vagina has an unusual smell, or if your symptoms get worse or do not improve after treatment. This information is not intended to replace advice given to you by your health care provider. Make sure you discuss any questions you have with your health care provider. Document Revised: 04/30/2017 Document Reviewed: 02/11/2017 Elsevier Patient Education  2020 Elsevier Inc.  

## 2019-10-19 NOTE — Telephone Encounter (Signed)
Call returned from [facility], Vikki Ports. She reviewed request with Dr. Marisa Hua, ok to proceed with vaginal estrogen cream, no concerns related to vasculitis.   Routing to Dr. Edward Jolly.

## 2019-10-20 ENCOUNTER — Telehealth: Payer: Self-pay

## 2019-10-20 LAB — VAGINITIS/VAGINOSIS, DNA PROBE
Candida Species: NEGATIVE
Gardnerella vaginalis: NEGATIVE
Trichomonas vaginosis: NEGATIVE

## 2019-10-20 MED ORDER — ESTRADIOL 0.1 MG/GM VA CREA: TOPICAL_CREAM | VAGINAL | 0 refills | Status: DC

## 2019-10-20 NOTE — Telephone Encounter (Signed)
Patton Salles, MD  10/20/2019 11:28 AM EDT    Results to patient through My Chart.  Hello Angie,   Your vaginitis testing is negative for infection.  It is ok for you to start vaginal estrogen cream!  Please contact the office for any questions.   Have a good day!  Conley Simmonds, MD   Call to patient, left detailed message on home number, ok per dpr, name identified on voicemail. Advised per Dr. Edward Jolly. Return call to office if any additional questions.   Encounter closed.

## 2019-10-20 NOTE — Telephone Encounter (Signed)
Please contact patient to share this information and send in a prescription for: Vaginal estradiol cream 0.01% Sig:  place 1/2 gram per vagina at hs nightly for 2 weeks.  Then place 1/2 gram per vaginal at hs at night two to three times per week.  Disp:  42.5 grams. RF:  zero.  I recommend not starting this until after her vaginitis testing has returned and we have treated any other vaginitis first.

## 2019-10-20 NOTE — Telephone Encounter (Signed)
Call to patient, left detailed message, name identified on voicemail, ok per dpr. Advised as seen below per Dr. Edward Jolly, Rx has been sent to [facility] in [place]. Again, advised not to start medication until after she has been notified of 10/19/19 vaginitis results and treated. Return call to office if any additional questions.   Encounter closed.

## 2019-10-20 NOTE — Telephone Encounter (Signed)
Patient is returning a call to [facility] for test results.

## 2019-10-20 NOTE — Telephone Encounter (Signed)
10/19/19 vaginitis testing negative.   Dr. Edward Jolly -ok to start vaginal estrogen cream?

## 2019-11-01 ENCOUNTER — Other Ambulatory Visit: Payer: Self-pay

## 2019-11-02 ENCOUNTER — Ambulatory Visit (INDEPENDENT_AMBULATORY_CARE_PROVIDER_SITE_OTHER): Payer: 59

## 2019-11-02 ENCOUNTER — Ambulatory Visit: Payer: 59 | Admitting: Obstetrics and Gynecology

## 2019-11-02 ENCOUNTER — Encounter: Payer: Self-pay | Admitting: Obstetrics and Gynecology

## 2019-11-02 VITALS — BP 130/78 | HR 78 | Temp 97.7°F | Resp 14 | Ht 66.0 in | Wt 221.4 lb

## 2019-11-02 DIAGNOSIS — N952 Postmenopausal atrophic vaginitis: Secondary | ICD-10-CM | POA: Diagnosis not present

## 2019-11-02 DIAGNOSIS — R140 Abdominal distension (gaseous): Secondary | ICD-10-CM

## 2019-11-02 MED ORDER — ESTRADIOL 10 MCG VA TABS: ORAL_TABLET | VAGINAL | 2 refills | Status: DC

## 2019-11-02 NOTE — Progress Notes (Signed)
GYNECOLOGY  VISIT   HPI: 54 y.o.   Married  Caucasian  female   [phone] with Patient's last menstrual period was 06/01/2002 (lmp unknown).   here for pelvic ultrasound for postmenopausal bleeding and abdominal bloating.  She has had a hysterectomy. She has been worried about her symptoms.   She had signs of significant atrophy on pelvic exam on 10/19/19. She received an Rx for vaginal estrogen which was approved by her dermatologist treating her vasculitis.   States the vaginal estrogen cream is messy.  She is currently using it nightly for the first two weeks.   Enjoying her grandchildren.  GYNECOLOGIC HISTORY: Patient's last menstrual period was 06/01/2002 (lmp unknown). Contraception: Hyst Menopausal hormone therapy:  none Last mammogram: 03-29-19 neg/density B/BiRads1 Last pap smear: 2010 Neg        OB History    Gravida  3   Para  3   Term  3   Preterm      AB      Living  3     SAB      TAB      Ectopic      Multiple      Live Births                 There are no problems to display for this patient.   Past Medical History:  Diagnosis Date  . Allergy   . Anxiety   . Breast mass   . Childhood asthma    no problems as an adult  . Depression   . Dyspareunia   . Hx of adenomatous colonic polyps   . Hypercholesteremia   . Thyroid disease   . Vasculitis [facility])     Past Surgical History:  Procedure Laterality Date  . ABDOMINAL HYSTERECTOMY  2004   TVH--still has ovaries  . BLADDER SUSPENSION    . breast biopsy Left August 2016   fibroma  . BREAST BIOPSY Left    benign  . BREAST SURGERY  12/2014   Benign Lt.breast bx  . CARPAL TUNNEL RELEASE Bilateral 2017  . CHOLECYSTECTOMY  2015  . COLONOSCOPY  02/11/2015   [facility] Dr Caryl Never - hx polyps  . FOOT FRACTURE SURGERY Right 11/2013  . MOUTH SURGERY     gum graft x 4  . REFRACTIVE SURGERY      Current Outpatient Medications  Medication Sig Dispense Refill  . albuterol (PROVENTIL  HFA;VENTOLIN HFA) 108 (90 BASE) MCG/ACT inhaler Inhale 2 puffs into the lungs. Takes prn    . Cholecalciferol (VITAMIN D3) 2000 UNITS capsule Take 2,000 Units by mouth daily.    Marland Kitchen estradiol (ESTRACE) 0.1 MG/GM vaginal cream Place 1/2 gram per vagina at hs nightly for 2 weeks. Then place 1/2 gram per vagina at hs two to three times per week. 42.5 g 0  . fexofenadine (ALLEGRA) 180 MG tablet Take 180 mg by mouth daily.    . folic acid (FOLVITE) 1 MG tablet Take by mouth.    . levothyroxine (SYNTHROID, LEVOTHROID) 50 MCG tablet Take 50 mcg by mouth daily.    . methotrexate (RHEUMATREX) 2.5 MG tablet Take 10 mg by mouth once a week.    . rosuvastatin (CRESTOR) 20 MG tablet Take 20 mg by mouth at bedtime.    . silver sulfADIAZINE (SILVADENE) 1 % cream     . timolol (TIMOPTIC) 0.5 % ophthalmic solution Apply 1 drop on the wound twice daily     No current facility-administered medications for  this visit.     ALLERGIES: Erythromycin and Venlafaxine  Family History  Problem Relation Age of Onset  . Diabetes Mother        borderline  . Hypertension Mother   . Thyroid disease Mother   . Heart failure Brother   . Hypertension Brother   . Heart failure Brother   . Pancreatic cancer Father   . Colon polyps Sister 26       precancer  . Asthma Brother   . Breast cancer Paternal Aunt 65       lumpectomy and survived  . Colon cancer Neg Hx   . Rectal cancer Neg Hx   . Stomach cancer Neg Hx     Social History   Socioeconomic History  . Marital status: Married    Spouse name: Not on file  . Number of children: Not on file  . Years of education: Not on file  . Highest education level: Not on file  Occupational History  . Not on file  Tobacco Use  . Smoking status: Former Smoker    Years: 20.00    Types: Cigarettes    Quit date: 06/01/2009    Years since quitting: 10.4  . Smokeless tobacco: Never Used  . Tobacco comment: socially  Substance and Sexual Activity  . Alcohol use: Yes     Alcohol/week: 8.0 - 15.0 standard drinks    Types: 7 - 14 Glasses of wine, 1 Standard drinks or equivalent per week  . Drug use: No  . Sexual activity: Yes    Partners: Male    Birth control/protection: Surgical    Comment: hysterectomy  Other Topics Concern  . Not on file  Social History Narrative  . Not on file   Social Determinants of Health   Financial Resource Strain:   . Difficulty of Paying Living Expenses:   Food Insecurity:   . Worried About Programme researcher, broadcasting/film/video in the Last Year:   . Barista in the Last Year:   Transportation Needs:   . Freight forwarder (Medical):   Marland Kitchen Lack of Transportation (Non-Medical):   Physical Activity:   . Days of Exercise per Week:   . Minutes of Exercise per Session:   Stress:   . Feeling of Stress :   Social Connections:   . Frequency of Communication with Friends and Family:   . Frequency of Social Gatherings with Friends and Family:   . Attends Religious Services:   . Active Member of Clubs or Organizations:   . Attends Banker Meetings:   Marland Kitchen Marital Status:   Intimate Partner Violence:   . Fear of Current or Ex-Partner:   . Emotionally Abused:   Marland Kitchen Physically Abused:   . Sexually Abused:     Review of Systems  All other systems reviewed and are negative.   PHYSICAL EXAMINATION:    BP 130/78 (Cuff Size: Large)   Pulse 78   Temp 97.7 F (36.5 C) (Temporal)   Resp 14   Ht 5\' 6"  (1.676 m)   Wt 221 lb 6.4 oz (100.4 kg)   LMP 06/01/2002 (LMP Unknown)   BMI 35.73 kg/m     General appearance: alert, cooperative and appears stated age   Pelvic 07/31/2002 Transabdominal and transvaginal.  Uterus absent.  Normal vaginal cuff.  Ovaries not seen due to overlying bowel. No adnexal masses. No free fluid.  ASSESSMENT  Abdominal bloating.  Vaginal atrophy and bleeding.    Status post hysterectomy.  PLAN  We reviewed her [place] images and findings.  We discussed treatment options for vaginal atrophy.  She  will stop the vaginal estrogen cream and start Vagifem.  Instructed in use.  We talked about dietary changes to reduce her abdominal bloating.   She currently declines a nutrition consultation.  I recommended follow up with her PCP or GI if her bloating persists. Fu for annual exam and prn.    An After Visit Summary was printed and given to the patient.  _15_____ minutes face to face time of which over 50% was spent in counseling.

## 2019-12-20 ENCOUNTER — Ambulatory Visit: Payer: 59 | Admitting: Obstetrics and Gynecology

## 2020-01-01 NOTE — Progress Notes (Signed)
54 y.o. G14P3003 Married Caucasian female here for annual exam.    She is using vaginal estrogen tablets, ok'd by her dermatologist.  Using a course of steroids to treat LE vasculitis.  Also uses Xeroform and Gentian Violet.  Her vasculitis flare has been attributed to her Moderna Covid vaccine, April 20th.   No leakage of urine with laugh or cough.   Taking several Tums a day.   Mom passed 11/2018.  PCP:   Alysia Penna, MD  Patient's last menstrual period was 06/01/2002 (lmp unknown).           Sexually active: Yes.    The current method of family planning is status post hysterectomy--ovaries remain.    Exercising: No.  The patient does not participate in regular exercise at present.--d/t vasculitis in feet Smoker:  Former  Health Maintenance: Pap: 2010 Neg History of abnormal Pap:  no MMG: 03-29-19 Neg/density B/BiRads1 Colonoscopy: 08-04-19 normal;next 5 years BMD:   n/a  Result  n/a TDaP:  PCP Gardasil:   no HIV: Neg in the past Hep C:never Screening Labs:  PCP.   reports that she quit smoking about 10 years ago. Her smoking use included cigarettes. She quit after 20.00 years of use. She has never used smokeless tobacco. She reports current alcohol use. She reports that she does not use drugs.  Past Medical History:  Diagnosis Date  . Allergy   . Anxiety   . Breast mass   . Childhood asthma    no problems as an adult  . Depression   . Dyspareunia   . Hx of adenomatous colonic polyps   . Hypercholesteremia   . Thyroid disease   . Vasculitis [facility])     Past Surgical History:  Procedure Laterality Date  . ABDOMINAL HYSTERECTOMY  2004   TVH--still has ovaries  . BLADDER SUSPENSION    . breast biopsy Left August 2016   fibroma  . BREAST BIOPSY Left    benign  . BREAST SURGERY  12/2014   Benign Lt.breast bx  . CARPAL TUNNEL RELEASE Bilateral 2017  . CHOLECYSTECTOMY  2015  . COLONOSCOPY  02/11/2015   [facility] Dr Caryl Never - hx polyps  . FOOT FRACTURE SURGERY  Right 11/2013  . MOUTH SURGERY     gum graft x 4  . REFRACTIVE SURGERY      Current Outpatient Medications  Medication Sig Dispense Refill  . albuterol (PROVENTIL HFA;VENTOLIN HFA) 108 (90 BASE) MCG/ACT inhaler Inhale 2 puffs into the lungs. Takes prn    . Cholecalciferol (VITAMIN D3) 2000 UNITS capsule Take 2,000 Units by mouth daily.    Marland Kitchen CONTRAVE 8-90 MG TB12 Take 2 tablets by mouth 2 (two) times daily.    . Estradiol 10 MCG TABS vaginal tablet Place tablet (10 mcg) per vagina at hs for 2 weeks and then place one tablet (10 mcg) per vagina at hs twice weekly. 18 tablet 2  . fexofenadine (ALLEGRA) 180 MG tablet Take 180 mg by mouth daily.    . folic acid (FOLVITE) 1 MG tablet Take by mouth.    Marland Kitchen HYDROcodone-acetaminophen (NORCO/VICODIN) 5-325 MG tablet Take 1 tablet by mouth every 6 (six) hours as needed.    . lactobacillus acidophilus (BACID) TABS tablet Take 1 tablet by mouth at bedtime.    Marland Kitchen levothyroxine (SYNTHROID, LEVOTHROID) 50 MCG tablet Take 50 mcg by mouth daily.    . methotrexate (RHEUMATREX) 2.5 MG tablet Take 10 mg by mouth once a week.    . mupirocin  ointment (BACTROBAN) 2 % Apply topically 3 (three) times daily.    . predniSONE (DELTASONE) 10 MG tablet Take by mouth.    . rosuvastatin (CRESTOR) 20 MG tablet Take 20 mg by mouth at bedtime.    . silver sulfADIAZINE (SILVADENE) 1 % cream      No current facility-administered medications for this visit.    Family History  Problem Relation Age of Onset  . Diabetes Mother        borderline  . Hypertension Mother   . Thyroid disease Mother   . Dementia Mother   . Heart failure Brother   . Hypertension Brother   . Heart failure Brother   . Pancreatic cancer Father   . Colon polyps Sister 26       precancer  . Asthma Brother   . Breast cancer Paternal Aunt 53       lumpectomy and survived  . Colon cancer Neg Hx   . Rectal cancer Neg Hx   . Stomach cancer Neg Hx     Review of Systems  All other systems reviewed  and are negative.   Exam:   BP 140/82 (Cuff Size: Large)   Pulse 80   Resp 16   Ht 5' 6.5" (1.689 m)   Wt 226 lb 6.4 oz (102.7 kg)   LMP 06/01/2002 (LMP Unknown)   BMI 35.99 kg/m     General appearance: alert, cooperative and appears stated age Head: normocephalic, without obvious abnormality, atraumatic Neck: no adenopathy, supple, symmetrical, trachea midline and thyroid normal to inspection and palpation Lungs: clear to auscultation bilaterally Breasts: normal appearance, no masses or tenderness, No nipple retraction or dimpling, No nipple discharge or bleeding, No axillary adenopathy Heart: regular rate and rhythm Abdomen: soft, non-tender; no masses, no organomegaly Extremities: LEs with bandages and eschars.   Skin: skin color, texture, turgor normal. No rashes or lesions Lymph nodes: cervical, supraclavicular, and axillary nodes normal. Neurologic: grossly normal  Pelvic: External genitalia:  no lesions              No abnormal inguinal nodes palpated.              Urethra:  normal appearing urethra with no masses, tenderness or lesions              Bartholins and Skenes: normal                 Vagina: normal appearing vagina with normal color and discharge, no lesions              Cervix: absent              Pap taken: No. Bimanual Exam:  Uterus:  absent              Adnexa: no mass, fullness, tenderness              Rectal exam: Yes.  .  Confirms.              Anus:  normal sphincter tone, no lesions  Chaperone was present for exam.  Assessment:   Well woman visit with normal exam. Status post TVH and probably midurethral sling. Still has ovaries. Mild stress incontinence. Hx left breast fibroma.Normal breast exam today.  Vasculitis of LEs.   Plan: Mammogram screening discussed. Self breast awareness reviewed. Pap and HR HPV as above. Guidelines for Calcium, Vitamin D, regular exercise program including cardiovascular and weight bearing exercise. She will  follow up with her  dermatologist regarding her vasculitis. Consider stopping vaginal estrogen and starting vitamin E suppositories.  She will call if she needs refills of the vagifem.  I did discuss potential effect on existing breast cancer.  She will check on the status of her TDap.  Follow up annually and prn.   After visit summary provided.

## 2020-01-02 ENCOUNTER — Ambulatory Visit: Payer: 59 | Admitting: Obstetrics and Gynecology

## 2020-01-02 ENCOUNTER — Encounter: Payer: Self-pay | Admitting: Obstetrics and Gynecology

## 2020-01-02 ENCOUNTER — Other Ambulatory Visit: Payer: Self-pay

## 2020-01-02 VITALS — BP 140/82 | HR 80 | Resp 16 | Ht 66.5 in | Wt 226.4 lb

## 2020-01-02 DIAGNOSIS — Z01419 Encounter for gynecological examination (general) (routine) without abnormal findings: Secondary | ICD-10-CM

## 2020-01-02 NOTE — Patient Instructions (Signed)

## 2020-02-14 ENCOUNTER — Other Ambulatory Visit: Payer: Self-pay | Admitting: Obstetrics and Gynecology

## 2020-02-14 DIAGNOSIS — Z1231 Encounter for screening mammogram for malignant neoplasm of breast: Secondary | ICD-10-CM

## 2020-03-28 ENCOUNTER — Other Ambulatory Visit: Payer: Self-pay | Admitting: Obstetrics and Gynecology

## 2020-03-28 DIAGNOSIS — Z01419 Encounter for gynecological examination (general) (routine) without abnormal findings: Secondary | ICD-10-CM

## 2020-03-28 NOTE — Telephone Encounter (Signed)
Medication refill request: Estradiol Last AEX:  01/02/20 Next AEX: 01/02/21 Last MMG (if hormonal medication request): 03/29/19  Neg  Refill authorized: 16/3

## 2020-03-29 ENCOUNTER — Other Ambulatory Visit: Payer: Self-pay

## 2020-03-29 ENCOUNTER — Ambulatory Visit
Admission: RE | Admit: 2020-03-29 | Discharge: 2020-03-29 | Disposition: A | Discharge status: Home / Self Care | Payer: 59 | Source: Ambulatory Visit | Attending: Obstetrics and Gynecology | Admitting: Obstetrics and Gynecology

## 2020-03-29 DIAGNOSIS — Z1231 Encounter for screening mammogram for malignant neoplasm of breast: Secondary | ICD-10-CM

## 2020-06-04 ENCOUNTER — Telehealth: Payer: Self-pay | Admitting: Unknown Physician Specialty

## 2020-06-04 NOTE — Telephone Encounter (Signed)
Called to Discuss with patient about Covid symptoms and the use of the monoclonal antibody infusion for those with mild to moderate Covid symptoms and at a high risk of hospitalization.     Pt appears to qualify for this infusion due to co-morbid conditions and/or a member of an at-risk group in accordance with the FDA Emergency Use Authorization.    Unable to reach pt   LMOM 

## 2020-07-04 ENCOUNTER — Ambulatory Visit: Payer: 59 | Admitting: Obstetrics and Gynecology

## 2021-01-02 ENCOUNTER — Ambulatory Visit: Payer: 59 | Admitting: Obstetrics and Gynecology

## 2021-01-28 NOTE — Progress Notes (Deleted)
55 y.o. G19P3003 Married Caucasian female here for annual exam.    PCP:     Patient's last menstrual period was 06/01/2002 (lmp unknown).           Sexually active: {yes no:314532}  The current method of family planning is {contraception:315051}.    Exercising: {yes UQ:333545}  {types:19826} Smoker:  Former  Health Maintenance: Pap:  2010 normal History of abnormal Pap:  no MMG:  03-29-20 normal Colonoscopy:  08-04-19 normal BMD:   N/A  Result   TDaP:  PCP Gardasil:   no HIV:Neg  Hep C:Never Screening Labs:  Hb today: PCP, Urine today:    reports that she quit smoking about 11 years ago. Her smoking use included cigarettes. She has never used smokeless tobacco. She reports current alcohol use. She reports that she does not use drugs.  Past Medical History:  Diagnosis Date   Allergy    Anxiety    Breast mass    Childhood asthma    no problems as an adult   Depression    Dyspareunia    Hx of adenomatous colonic polyps    Hypercholesteremia    Thyroid disease    Vasculitis (HCC)     Past Surgical History:  Procedure Laterality Date   ABDOMINAL HYSTERECTOMY  2004   TVH--still has ovaries   BLADDER SUSPENSION     breast biopsy Left August 2016   fibroma   BREAST BIOPSY Left    benign   BREAST SURGERY  12/2014   Benign Lt.breast bx   CARPAL TUNNEL RELEASE Bilateral 2017   CHOLECYSTECTOMY  2015   COLONOSCOPY  02/11/2015   [facility] Never - hx polyps   FOOT FRACTURE SURGERY Right 11/2013   MOUTH SURGERY     gum graft x 4   REFRACTIVE SURGERY      Current Outpatient Medications  Medication Sig Dispense Refill   albuterol (PROVENTIL HFA;VENTOLIN HFA) 108 (90 BASE) MCG/ACT inhaler Inhale 2 puffs into the lungs. Takes prn     Cholecalciferol (VITAMIN D3) 2000 UNITS capsule Take 2,000 Units by mouth daily.     CONTRAVE 8-90 MG TB12 Take 2 tablets by mouth 2 (two) times daily.     Estradiol (YUVAFEM) 10 MCG TABS vaginal tablet Place 1 tablet (10 mcg total) vaginally 2  (two) times a week. Place at bedtime. 24 tablet 2   fexofenadine (ALLEGRA) 180 MG tablet Take 180 mg by mouth daily.     folic acid (FOLVITE) 1 MG tablet Take by mouth.     HYDROcodone-acetaminophen (NORCO/VICODIN) 5-325 MG tablet Take 1 tablet by mouth every 6 (six) hours as needed.     lactobacillus acidophilus (BACID) TABS tablet Take 1 tablet by mouth at bedtime.     levothyroxine (SYNTHROID, LEVOTHROID) 50 MCG tablet Take 50 mcg by mouth daily.     methotrexate (RHEUMATREX) 2.5 MG tablet Take 10 mg by mouth once a week.     mupirocin ointment (BACTROBAN) 2 % Apply topically 3 (three) times daily.     predniSONE (DELTASONE) 10 MG tablet Take by mouth.     rosuvastatin (CRESTOR) 20 MG tablet Take 20 mg by mouth at bedtime.     silver sulfADIAZINE (SILVADENE) 1 % cream      No current facility-administered medications for this visit.    Family History  Problem Relation Age of Onset   Diabetes Mother        borderline   Hypertension Mother    Thyroid disease Mother  Dementia Mother    Heart failure Brother    Hypertension Brother    Heart failure Brother    Pancreatic cancer Father    Colon polyps Sister 44       precancer   Asthma Brother    Breast cancer Paternal Aunt 55       lumpectomy and survived   Colon cancer Neg Hx    Rectal cancer Neg Hx    Stomach cancer Neg Hx     Review of Systems  Exam:   LMP 06/01/2002 (LMP Unknown)     General appearance: alert, cooperative and appears stated age Head: normocephalic, without obvious abnormality, atraumatic Neck: no adenopathy, supple, symmetrical, trachea midline and thyroid normal to inspection and palpation Lungs: clear to auscultation bilaterally Breasts: normal appearance, no masses or tenderness, No nipple retraction or dimpling, No nipple discharge or bleeding, No axillary adenopathy Heart: regular rate and rhythm Abdomen: soft, non-tender; no masses, no organomegaly Extremities: extremities normal, atraumatic,  no cyanosis or edema Skin: skin color, texture, turgor normal. No rashes or lesions Lymph nodes: cervical, supraclavicular, and axillary nodes normal. Neurologic: grossly normal  Pelvic: External genitalia:  no lesions              No abnormal inguinal nodes palpated.              Urethra:  normal appearing urethra with no masses, tenderness or lesions              Bartholins and Skenes: normal                 Vagina: normal appearing vagina with normal color and discharge, no lesions              Cervix: no lesions              Pap taken: {yes no:314532} Bimanual Exam:  Uterus:  normal size, contour, position, consistency, mobility, non-tender              Adnexa: no mass, fullness, tenderness              Rectal exam: {yes no:314532}.  Confirms.              Anus:  normal sphincter tone, no lesions  Chaperone was present for exam:  ***  Assessment:   Well woman visit with gynecologic exam.   Plan: Mammogram screening discussed. Self breast awareness reviewed. Pap and HR HPV as above. Guidelines for Calcium, Vitamin D, regular exercise program including cardiovascular and weight bearing exercise.   Follow up annually and prn.   Additional counseling given.  {yes [id number]. _______ minutes face to face time of which over 50% was spent in counseling.    After visit summary provided.

## 2021-02-07 ENCOUNTER — Ambulatory Visit: Payer: 59 | Admitting: Obstetrics and Gynecology

## 2021-02-25 ENCOUNTER — Other Ambulatory Visit: Payer: Self-pay | Admitting: Obstetrics and Gynecology

## 2021-02-25 DIAGNOSIS — Z1231 Encounter for screening mammogram for malignant neoplasm of breast: Secondary | ICD-10-CM

## 2021-03-03 ENCOUNTER — Other Ambulatory Visit: Payer: Self-pay

## 2021-03-03 ENCOUNTER — Ambulatory Visit (INDEPENDENT_AMBULATORY_CARE_PROVIDER_SITE_OTHER): Payer: 59 | Admitting: Obstetrics and Gynecology

## 2021-03-03 ENCOUNTER — Encounter: Payer: Self-pay | Admitting: Obstetrics and Gynecology

## 2021-03-03 VITALS — BP 142/84 | HR 75 | Ht 65.5 in | Wt 225.0 lb

## 2021-03-03 DIAGNOSIS — Z01419 Encounter for gynecological examination (general) (routine) without abnormal findings: Secondary | ICD-10-CM | POA: Diagnosis not present

## 2021-03-03 MED ORDER — NYSTATIN 100000 UNIT/GM EX POWD: 1.0000 "application " | Freq: Three times a day (TID) | CUTANEOUS | 2 refills | Status: DC

## 2021-03-03 NOTE — Patient Instructions (Signed)

## 2021-03-03 NOTE — Progress Notes (Signed)
55 y.o. G65P3003 Married Caucasian female here for annual exam.    Considering a breast reduction.  Having back pain.  Having redness, raw skin under her breast tissue.  She shows me a picture on her cell phone that looks like Candida of flexural fold of the breast skin.   Bladder control in manageable if she voids regularly.   Doing ok off of vaginal estrogen.  Brother in law died and her  daughter lost a pregnancy at 20 weeks. Patient will do counseling.  Moved to [place], [place].  PCP:  [facility] in [place]   Patient's last menstrual period was 06/01/2002 (lmp unknown).           Sexually active: Yes.    The current method of family planning is status post hysterectomy--ovaries remain.    Exercising: Yes.     High intensity work out at fitness center Smoker:  Former  Health Maintenance: Pap:  2010 Neg History of abnormal Pap:  no MMG:  03-29-20 Neg/BiRads1.  Scheduled for the end of this month, beginning of next month.  Colonoscopy: 08-04-19 normal;next 5years  BMD:   n/a  Result  n/a TDaP: PCP Gardasil:   no HIV:Neg in the past Hep C:never Screening Labs:  PCP   reports that she quit smoking about 11 years ago. Her smoking use included cigarettes. She has never used smokeless tobacco. She reports current alcohol use. She reports that she does not use drugs.  Past Medical History:  Diagnosis Date   Allergy    Anxiety    Breast mass    Childhood asthma    no problems as an adult   Depression    Dyspareunia    Hx of adenomatous colonic polyps    Hypercholesteremia    Thyroid disease    Vasculitis (HCC)     Past Surgical History:  Procedure Laterality Date   ABDOMINAL HYSTERECTOMY  2004   TVH--still has ovaries   BLADDER SUSPENSION     breast biopsy Left August 2016   fibroma   BREAST BIOPSY Left    benign   BREAST SURGERY  12/2014   Benign Lt.breast bx   CARPAL TUNNEL RELEASE Bilateral 2017   CHOLECYSTECTOMY  2015   COLONOSCOPY   02/11/2015   [facility] Never - hx polyps   FOOT FRACTURE SURGERY Right 11/2013   MOUTH SURGERY     gum graft x 4   REFRACTIVE SURGERY      Current Outpatient Medications  Medication Sig Dispense Refill   celecoxib (CELEBREX) 200 MG capsule Take 200 mg by mouth 2 (two) times daily.     Cholecalciferol (VITAMIN D3) 2000 UNITS capsule Take 2,000 Units by mouth daily.     cyclobenzaprine (FLEXERIL) 10 MG tablet TAKE 1 TABLET BY MOUTH AT BEDTIME AS NEEDED FOR MUSCLE SPASMS 30 DAY(S)     fexofenadine (ALLEGRA) 180 MG tablet Take 180 mg by mouth daily.     folic acid (FOLVITE) 1 MG tablet Take by mouth.     levothyroxine (SYNTHROID, LEVOTHROID) 50 MCG tablet Take 50 mcg by mouth daily.     Omega-3 Fatty Acids (FISH OIL) 1000 MG CAPS Take 1 capsule by mouth daily.     rosuvastatin (CRESTOR) 20 MG tablet Take 20 mg by mouth at bedtime.     albuterol (PROVENTIL HFA;VENTOLIN HFA) 108 (90 BASE) MCG/ACT inhaler Inhale 2 puffs into the lungs. Takes prn (Patient not taking: Reported on 03/03/2021)     No current facility-administered  medications for this visit.    Family History  Problem Relation Age of Onset   Diabetes Mother        borderline   Hypertension Mother    Thyroid disease Mother    Dementia Mother    Heart failure Brother    Hypertension Brother    Heart failure Brother    Pancreatic cancer Father    Colon polyps Sister 40       precancer   Asthma Brother    Breast cancer Paternal Aunt 35       lumpectomy and survived   Colon cancer Neg Hx    Rectal cancer Neg Hx    Stomach cancer Neg Hx     Review of Systems  Skin:        Continuous rash/irritation under breasts  All other systems reviewed and are negative.  Exam:   BP (!) 142/84   Pulse 75   Ht 5' 5.5" (1.664 m)   Wt 225 lb (102.1 kg)   LMP 06/01/2002 (LMP Unknown)   SpO2 100%   BMI 36.87 kg/m     General appearance: alert, cooperative and appears stated age Head: normocephalic, without obvious abnormality,  atraumatic Neck: no adenopathy, supple, symmetrical, trachea midline and thyroid normal to inspection and palpation Lungs: clear to auscultation bilaterally Breasts: normal appearance, no masses or tenderness, No nipple retraction or dimpling, No nipple discharge or bleeding, No axillary adenopathy Heart: regular rate and rhythm Abdomen: soft, non-tender; no masses, no organomegaly Extremities: extremities normal, atraumatic, no cyanosis or edema Skin: skin color, texture, turgor normal. No rashes or lesions Lymph nodes: cervical, supraclavicular, and axillary nodes normal. Neurologic: grossly normal  Pelvic: External genitalia:  no lesions              No abnormal inguinal nodes palpated.              Urethra:  normal appearing urethra with no masses, tenderness or lesions              Bartholins and Skenes: normal                 Vagina: normal appearing vagina with normal color and discharge, no lesions              Cervix: absent              Pap taken: no Bimanual Exam:  Uterus:  absent              Adnexa: no mass, fullness, tenderness              Rectal exam: yes.  Confirms.              Anus:  normal sphincter tone, no lesions  Chaperone was present for exam:  Marchelle Folks, CMA  Assessment:   Well woman visit with gynecologic exam. Status post TVH and probably midurethral sling. Still has ovaries. Stress incontinence. Hx left breast fibroma.   Vasculitis of LEs.   Candida of breast. Bereavement.   Plan: Mammogram screening discussed. Self breast awareness reviewed. Pap and HR HPV as above. Guidelines for Calcium, Vitamin D, regular exercise program including cardiovascular and weight bearing exercise. Rx for Nystatin powder.  Support given for her recent losses.  I did suggest Hospice as an additional support system. Follow up annually and prn.   After visit summary provided.

## 2021-03-21 DIAGNOSIS — R7303 Prediabetes: Secondary | ICD-10-CM | POA: Insufficient documentation

## 2021-03-31 ENCOUNTER — Ambulatory Visit
Admission: RE | Admit: 2021-03-31 | Discharge: 2021-03-31 | Disposition: A | Discharge status: Home / Self Care | Payer: 59 | Source: Ambulatory Visit | Attending: Obstetrics and Gynecology | Admitting: Obstetrics and Gynecology

## 2021-03-31 ENCOUNTER — Other Ambulatory Visit: Payer: Self-pay

## 2021-03-31 DIAGNOSIS — Z1231 Encounter for screening mammogram for malignant neoplasm of breast: Secondary | ICD-10-CM

## 2021-08-05 IMAGING — MG DIGITAL SCREENING BILAT W/ CAD
4 series · 4 of 4 positions shown · non-contrast
Comparison: Previous exam(s).

CLINICAL DATA: Screening.

EXAM:
DIGITAL SCREENING BILATERAL MAMMOGRAM WITH CAD

[L MLO]
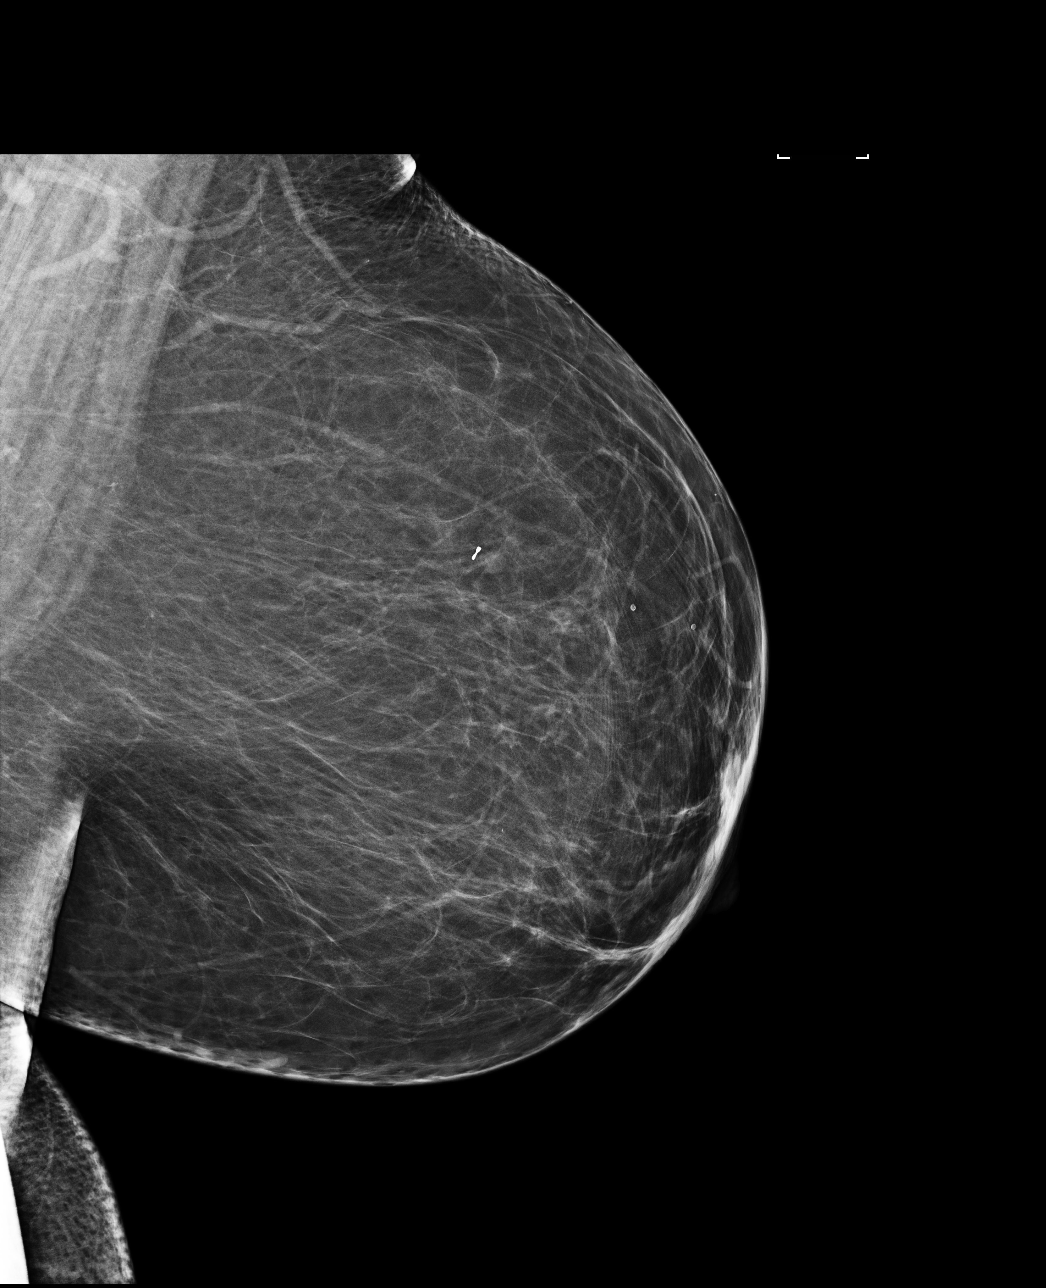

[R MLO]
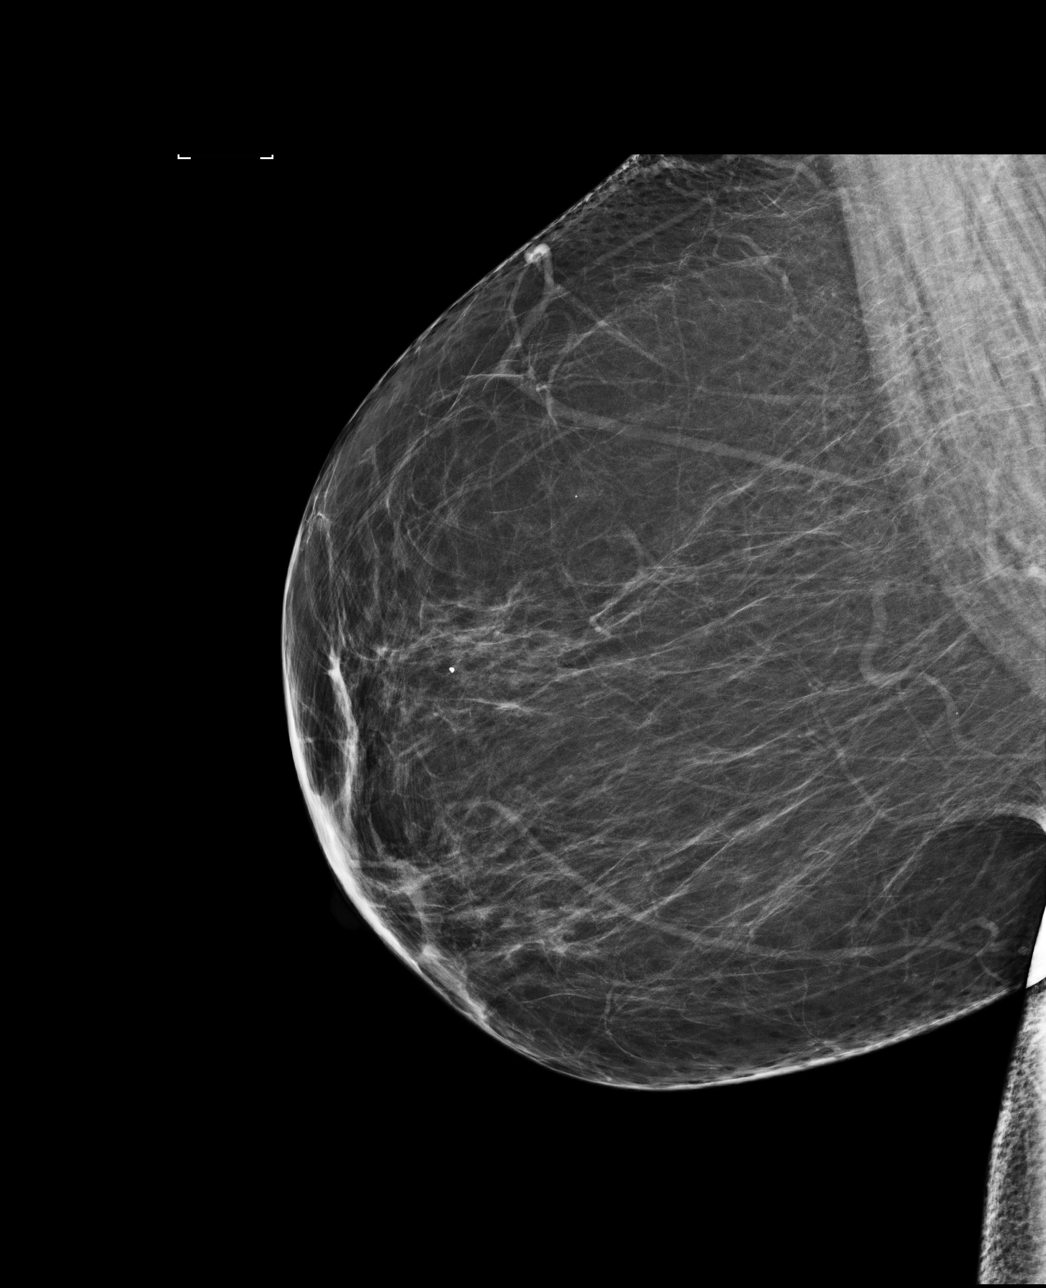

[R CC]
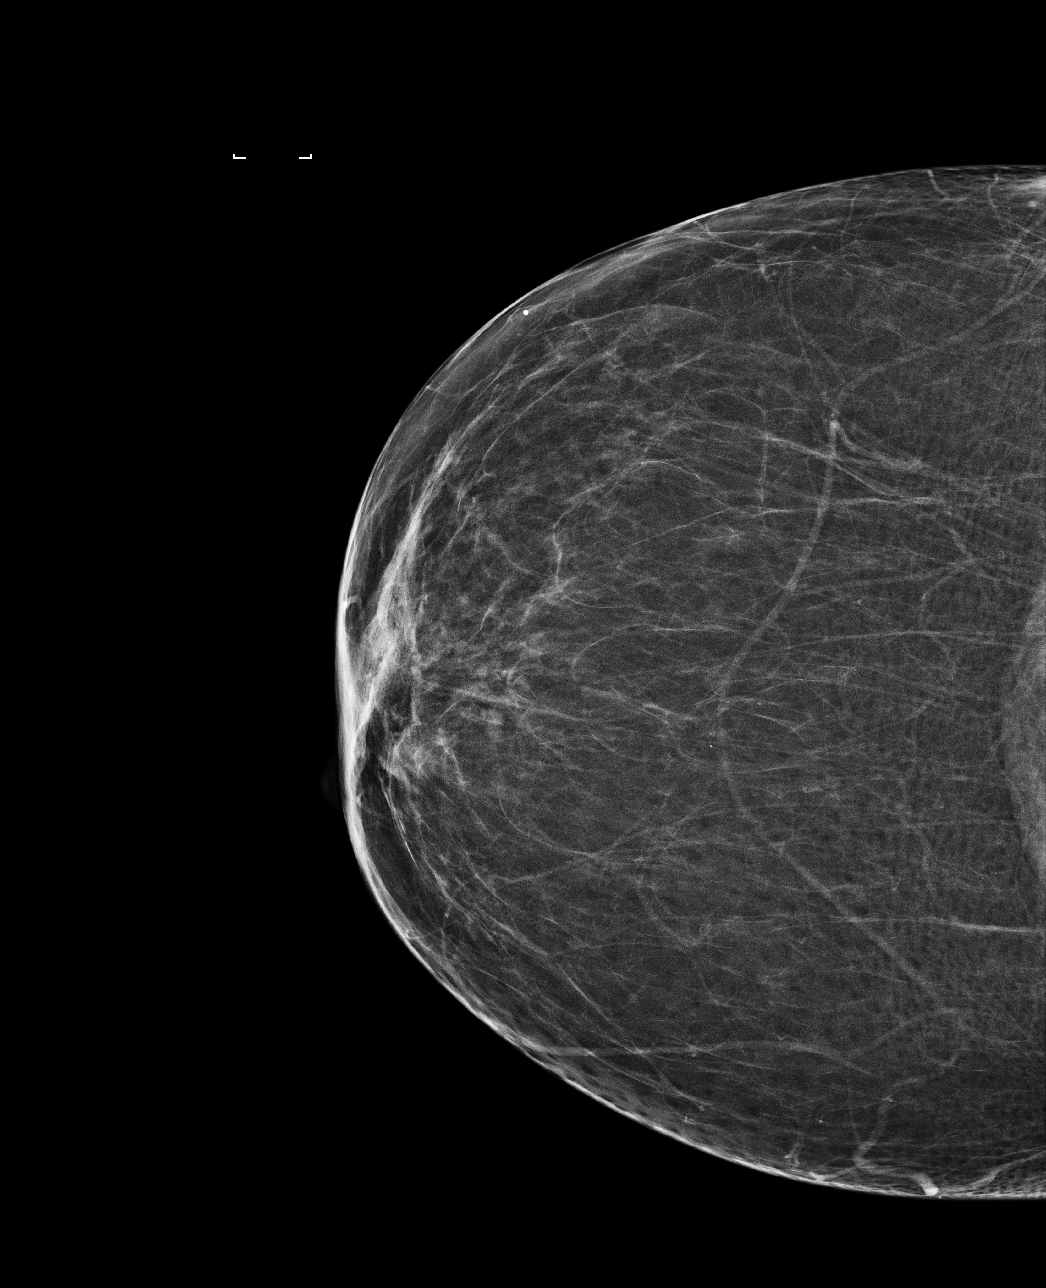

[L CC]
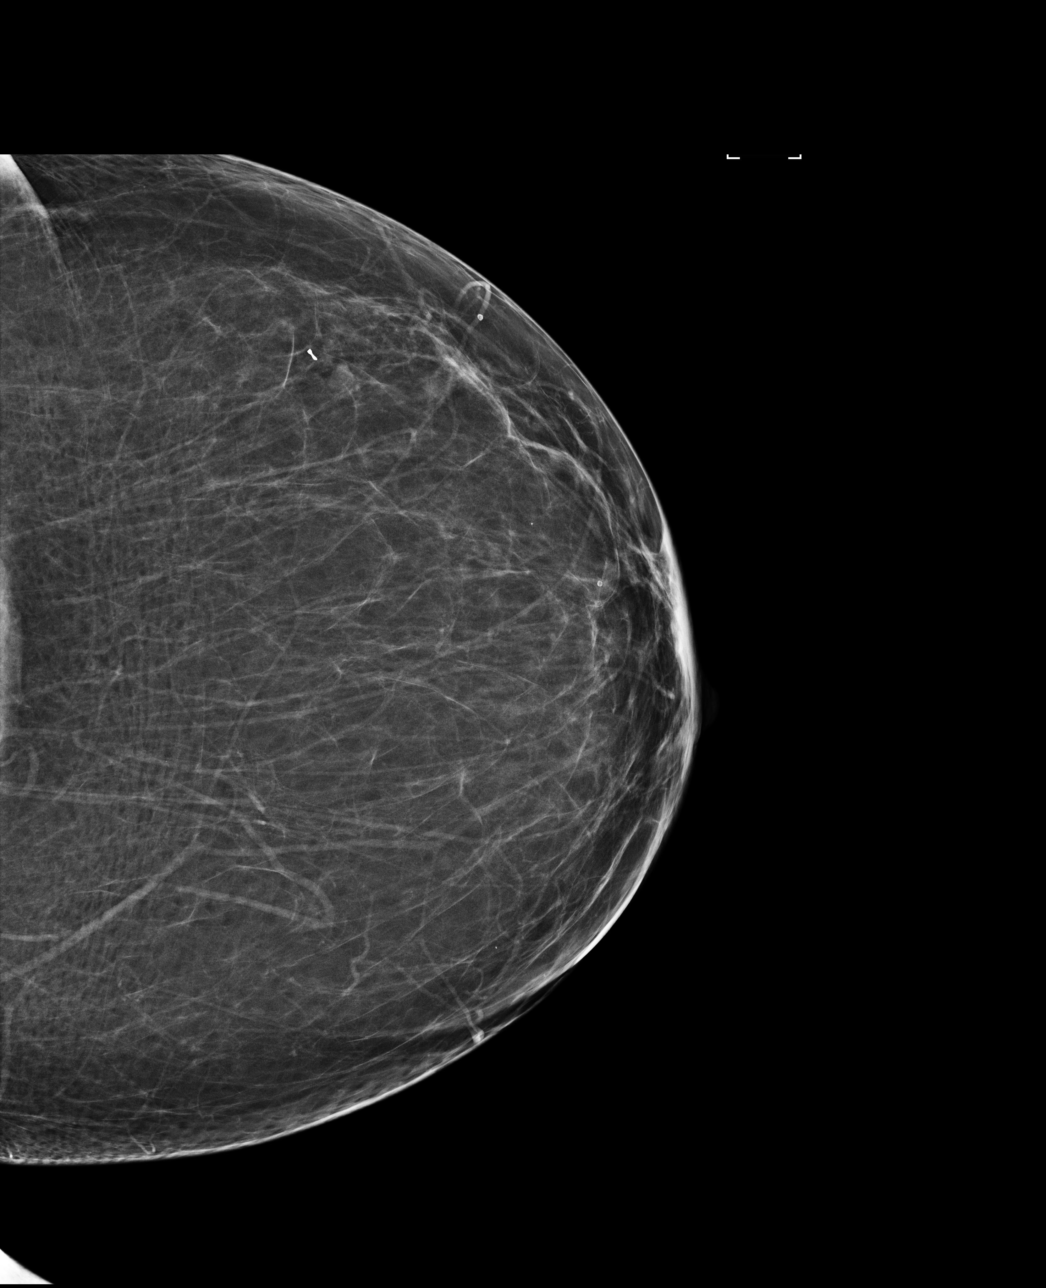

[4 of 4 positions shown; findings below may reference images not displayed]

ACR Breast Density Category b: There are scattered areas of
fibroglandular density.
FINDINGS: There are no findings suspicious for malignancy. Images were
processed with CAD.
IMPRESSION: No mammographic evidence of malignancy. A result letter of this
screening mammogram will be mailed directly to the patient.

RECOMMENDATION:
Screening mammogram in one year. (Code:AS-G-LCT)

BI-RADS CATEGORY  1: Negative.

## 2021-11-14 DIAGNOSIS — E559 Vitamin D deficiency, unspecified: Secondary | ICD-10-CM | POA: Insufficient documentation

## 2021-11-14 DIAGNOSIS — M19072 Primary osteoarthritis, left ankle and foot: Secondary | ICD-10-CM | POA: Insufficient documentation

## 2022-01-12 ENCOUNTER — Other Ambulatory Visit: Payer: Self-pay | Admitting: Obstetrics and Gynecology

## 2022-01-12 DIAGNOSIS — Z1231 Encounter for screening mammogram for malignant neoplasm of breast: Secondary | ICD-10-CM

## 2022-03-25 ENCOUNTER — Ambulatory Visit: Payer: 59 | Admitting: Obstetrics and Gynecology

## 2022-04-01 ENCOUNTER — Ambulatory Visit
Admission: RE | Admit: 2022-04-01 | Discharge: 2022-04-01 | Disposition: A | Discharge status: Home / Self Care | Payer: 59 | Source: Ambulatory Visit | Attending: Obstetrics and Gynecology | Admitting: Obstetrics and Gynecology

## 2022-04-01 DIAGNOSIS — Z1231 Encounter for screening mammogram for malignant neoplasm of breast: Secondary | ICD-10-CM

## 2022-04-13 ENCOUNTER — Other Ambulatory Visit (HOSPITAL_COMMUNITY): Payer: Self-pay

## 2022-04-13 MED ORDER — SAXENDA 18 MG/3ML ~~LOC~~ SOPN
PEN_INJECTOR | SUBCUTANEOUS | 3 refills | Status: DC
  Filled 2022-04-13: qty 15, 30d supply, fill #0
  Filled 2022-05-15: qty 15, 30d supply, fill #1

## 2022-04-14 ENCOUNTER — Other Ambulatory Visit (HOSPITAL_COMMUNITY): Payer: Self-pay

## 2022-04-16 ENCOUNTER — Encounter: Payer: Self-pay | Admitting: Obstetrics and Gynecology

## 2022-04-16 ENCOUNTER — Ambulatory Visit (INDEPENDENT_AMBULATORY_CARE_PROVIDER_SITE_OTHER): Payer: 59 | Admitting: Obstetrics and Gynecology

## 2022-04-16 VITALS — BP 118/80 | HR 74 | Ht 65.0 in | Wt 204.0 lb

## 2022-04-16 DIAGNOSIS — Z01419 Encounter for gynecological examination (general) (routine) without abnormal findings: Secondary | ICD-10-CM | POA: Diagnosis not present

## 2022-04-16 MED ORDER — NYSTATIN 100000 UNIT/GM EX POWD: 1.0000 | Freq: Three times a day (TID) | CUTANEOUS | 2 refills | Status: AC

## 2022-04-16 NOTE — Progress Notes (Signed)
56 y.o. G49P3003 Married Caucasian female here for annual exam.    Bladder function is ok.  No stress incontinence in general.   Lost 27 pounds.   PCP:   [facility], [place], [place]  Patient's last menstrual period was 06/01/2002 (lmp unknown).           Sexually active: Yes.    The current method of family planning is status post hysterectomy.    Exercising: Yes.      High intensity work out at fitness center  Smoker:  former  Health Maintenance: Pap:  2010 neg History of abnormal Pap:  no MMG:  04/01/2022 BI-RADS CATEGORY  1: Negative.  Colonoscopy:  08/04/2019 hemorrhoids.  FU in 5 - 10 years.  BMD:   n/a TDaP:  pcp Gardasil:   no HIV: neg in past Hep C:  no Screening Labs:  PCP Flu vaccine:  completed.  Covid vaccine:  she cannot do because of vasculitis flare with Covid vaccine.   reports that she quit smoking about 12 years ago. Her smoking use included cigarettes. She has never used smokeless tobacco. She reports current alcohol use. She reports that she does not use drugs.  Past Medical History:  Diagnosis Date   Allergy    Anxiety    Breast mass    Childhood asthma    no problems as an adult   Depression    Dyspareunia    Hx of adenomatous colonic polyps    Hypercholesteremia    Thyroid disease    Vasculitis (HCC)     Past Surgical History:  Procedure Laterality Date   ABDOMINAL HYSTERECTOMY  2004   TVH--still has ovaries   BLADDER SUSPENSION     breast biopsy Left 12/2014   fibroma   BREAST BIOPSY Left 2016   benign   BREAST SURGERY  12/2014   Benign Lt.breast bx   CARPAL TUNNEL RELEASE Bilateral 2017   CHOLECYSTECTOMY  2015   COLONOSCOPY  02/11/2015   [facility] Never - hx polyps   FOOT FRACTURE SURGERY Right 11/2013   MOUTH SURGERY     gum graft x 4   REFRACTIVE SURGERY      Current Outpatient Medications  Medication Sig Dispense Refill   Cholecalciferol (VITAMIN D3) 2000 UNITS capsule Take 2,000 Units by mouth daily.      fexofenadine (ALLEGRA) 180 MG tablet Take 180 mg by mouth daily.     levothyroxine (SYNTHROID, LEVOTHROID) 50 MCG tablet Take 50 mcg by mouth daily.     Liraglutide -Weight Management (SAXENDA) 18 MG/3ML SOPN Inject 0.6 mg into the skin for 1 week, increase by 0.6 mg weekly until reach 3 mg 15 mL 3   rosuvastatin (CRESTOR) 20 MG tablet Take 20 mg by mouth at bedtime.     No current facility-administered medications for this visit.    Family History  Problem Relation Age of Onset   Diabetes Mother        borderline   Hypertension Mother    Thyroid disease Mother    Dementia Mother    Heart failure Brother    Hypertension Brother    Heart failure Brother    Pancreatic cancer Father    Colon polyps Sister 54       precancer   Asthma Brother    Breast cancer Paternal Aunt 60       lumpectomy and survived   Colon cancer Neg Hx    Rectal cancer Neg Hx    Stomach cancer  Neg Hx     Review of Systems  All other systems reviewed and are negative.   Exam:   BP 118/80 (BP Location: Right Arm, Patient Position: Sitting, Cuff Size: Normal)   Pulse 74   Ht 5\' 5"  (1.651 m)   Wt 204 lb (92.5 kg)   LMP 06/01/2002 (LMP Unknown)   BMI 33.95 kg/m     General appearance: alert, cooperative and appears stated age Head: normocephalic, without obvious abnormality, atraumatic Neck: no adenopathy, supple, symmetrical, trachea midline and thyroid normal to inspection and palpation Lungs: clear to auscultation bilaterally Breasts: normal appearance, no masses or tenderness, No nipple retraction or dimpling, No nipple discharge or bleeding, No axillary adenopathy.  Slignt patches of erythema under breasts. Heart: regular rate and rhythm Abdomen: soft, non-tender; no masses, no organomegaly Extremities: extremities normal, atraumatic, no cyanosis or edema Skin: skin color, texture, turgor normal. No rashes or lesions Lymph nodes: cervical, supraclavicular, and axillary nodes normal. Neurologic:  grossly normal  Pelvic: External genitalia:  no lesions              No abnormal inguinal nodes palpated.              Urethra:  normal appearing urethra with no masses, tenderness or lesions              Bartholins and Skenes: normal                 Vagina: normal appearing vagina with normal color and discharge, no lesions              Cervix: absent              Pap taken: no Bimanual Exam:  Uterus:  absent              Adnexa: no mass, fullness, tenderness              Rectal exam: yes.  Confirms.              Anus:  normal sphincter tone, no lesions  Chaperone was present for exam:  Kimalexis  Assessment:   Well woman visit with gynecologic exam. Status post TVH and probably midurethral sling. Still has ovaries. Stress incontinence. Hx left breast fibroma.   Vasculitis of LEs.   Candida of breast. Successful weight loss.   Plan: Mammogram screening discussed. Self breast awareness reviewed. Pap and HR HPV as above. Guidelines for Calcium, Vitamin D, regular exercise program including cardiovascular and weight bearing exercise. Nystatin powder.  Labs with PCP. Follow up annually and prn.   After visit summary provided.

## 2022-04-16 NOTE — Patient Instructions (Signed)
EXERCISE AND DIET:  We recommended that you start or continue a regular exercise program for good health. Regular exercise means any activity that makes your heart beat faster and makes you sweat.  We recommend exercising at least 30 minutes per day at least 3 days a week, preferably 4 or 5.  We also recommend a diet low in fat and sugar.  Inactivity, poor dietary choices and obesity can cause diabetes, heart attack, stroke, and kidney damage, among others.    ALCOHOL AND SMOKING:  Women should limit their alcohol intake to no more than 7 drinks/beers/glasses of wine (combined, not each!) per week. Moderation of alcohol intake to this level decreases your risk of breast cancer and liver damage. And of course, no recreational drugs are part of a healthy lifestyle.  And absolutely no smoking or even second hand smoke. Most people know smoking can cause heart and lung diseases, but did you know it also contributes to weakening of your bones? Aging of your skin?  Yellowing of your teeth and nails?  CALCIUM AND VITAMIN D:  Adequate intake of calcium and Vitamin D are recommended.  The recommendations for exact amounts of these supplements seem to change often, but generally speaking 600 mg of calcium (either carbonate or citrate) and 800 units of Vitamin D per day seems prudent. Certain women may benefit from higher intake of Vitamin D.  If you are among these women, your doctor will have told you during your visit.    PAP SMEARS:  Pap smears, to check for cervical cancer or precancers,  have traditionally been done yearly, although recent scientific advances have shown that most women can have pap smears less often.  However, every woman still should have a physical exam from her gynecologist every year. It will include a breast check, inspection of the vulva and vagina to check for abnormal growths or skin changes, a visual exam of the cervix, and then an exam to evaluate the size and shape of the uterus and  ovaries.  And after 58 years of age, a rectal exam is indicated to check for rectal cancers. We will also provide age appropriate advice regarding health maintenance, like when you should have certain vaccines, screening for sexually transmitted diseases, bone density testing, colonoscopy, mammograms, etc.   MAMMOGRAMS:  All women over 40 years old should have a yearly mammogram. Many facilities now offer a "3D" mammogram, which may cost around $50 extra out of pocket. If possible,  we recommend you accept the option to have the 3D mammogram performed.  It both reduces the number of women who will be called back for extra views which then turn out to be normal, and it is better than the routine mammogram at detecting truly abnormal areas.    COLONOSCOPY:  Colonoscopy to screen for colon cancer is recommended for all women at age 50.  We know, you hate the idea of the prep.  We agree, BUT, having colon cancer and not knowing it is worse!!  Colon cancer so often starts as a polyp that can be seen and removed at colonscopy, which can quite literally save your life!  And if your first colonoscopy is normal and you have no family history of colon cancer, most women don't have to have it again for 10 years.  Once every ten years, you can do something that may end up saving your life, right?  We will be happy to help you get it scheduled when you are ready.    Be sure to check your insurance coverage so you understand how much it will cost.  It may be covered as a preventative service at no cost, but you should check your particular policy.    Calcium Content in Foods Calcium is the most abundant mineral in the body. Most of the body's calcium supply is stored in bones and teeth. Calcium helps many parts of the body function normally, including: Blood and blood vessels. Nerves. Hormones. Muscles. Bones and teeth. When your calcium stores are low, you may be at risk for low bone mass, bone loss, and broken bones  (fractures). When you get enough calcium, it helps to support strong bones and teeth throughout your life. Calcium is especially important for: Children during growth spurts. Girls during adolescence. Women who are pregnant or breastfeeding. Women after their menstrual cycle stops (postmenopause). Women whose menstrual cycle has stopped due to anorexia nervosa or regular intense exercise. People who cannot eat or digest dairy products. Vegans. Recommended daily amounts of calcium: Women (ages 19 to 50): 1,000 mg per day. Women (ages 51 and older): 1,200 mg per day. Men (ages 19 to 70): 1,000 mg per day. Men (ages 71 and older): 1,200 mg per day. Women (ages 9 to 18): 1,300 mg per day. Men (ages 9 to 18): 1,300 mg per day. General information Eat foods that are high in calcium. Try to get most of your calcium from food. Some people may benefit from taking calcium supplements. Check with your health care provider or diet and nutrition specialist (dietitian) before starting any calcium supplements. Calcium supplements may interact with certain medicines. Too much calcium may cause other health problems, such as constipation and kidney stones. For the body to absorb calcium, it needs vitamin D. Sources of vitamin D include: Skin exposure to direct sunlight. Foods, such as egg yolks, liver, mushrooms, saltwater fish, and fortified milk. Vitamin D supplements. Check with your health care provider or dietitian before starting any vitamin D supplements. What foods are high in calcium?  Foods that are high in calcium contain more than 100 milligrams per serving. Fruits Fortified orange juice or other fruit juice, 300 mg per 8 oz serving. Vegetables Collard greens, 360 mg per 8 oz serving. Kale, 100 mg per 8 oz serving. Bok choy, 160 mg per 8 oz serving. Grains Fortified ready-to-eat cereals, 100 to 1,000 mg per 8 oz serving. Fortified frozen waffles, 200 mg in 2 waffles. Oatmeal, 140 mg in  1 cup. Meats and other proteins Sardines, canned with bones, 325 mg per 3 oz serving. Salmon, canned with bones, 180 mg per 3 oz serving. Canned shrimp, 125 mg per 3 oz serving. Baked beans, 160 mg per 4 oz serving. Tofu, firm, made with calcium sulfate, 253 mg per 4 oz serving. Dairy Yogurt, plain, low-fat, 310 mg per 6 oz serving. Nonfat milk, 300 mg per 8 oz serving. American cheese, 195 mg per 1 oz serving. Cheddar cheese, 205 mg per 1 oz serving. Cottage cheese 2%, 105 mg per 4 oz serving. Fortified soy, rice, or almond milk, 300 mg per 8 oz serving. Mozzarella, part skim, 210 mg per 1 oz serving. The items listed above may not be a complete list of foods high in calcium. Actual amounts of calcium may be different depending on processing. Contact a dietitian for more information. What foods are lower in calcium? Foods that are lower in calcium contain 50 mg or less per serving. Fruits Apple, about 6 mg. Banana, about 12 mg.   Vegetables Lettuce, 19 mg per 2 oz serving. Tomato, about 11 mg. Grains Rice, 4 mg per 6 oz serving. Boiled potatoes, 14 mg per 8 oz serving. White bread, 6 mg per slice. Meats and other proteins Egg, 27 mg per 2 oz serving. Red meat, 7 mg per 4 oz serving. Chicken, 17 mg per 4 oz serving. Fish, cod, or trout, 20 mg per 4 oz serving. Dairy Cream cheese, regular, 14 mg per 1 Tbsp serving. Brie cheese, 50 mg per 1 oz serving. Parmesan cheese, 70 mg per 1 Tbsp serving. The items listed above may not be a complete list of foods lower in calcium. Actual amounts of calcium may be different depending on processing. Contact a dietitian for more information. Summary Calcium is an important mineral in the body because it affects many functions. Getting enough calcium helps support strong bones and teeth throughout your life. Try to get most of your calcium from food. Calcium supplements may interact with certain medicines. Check with your health care provider  or dietitian before starting any calcium supplements. This information is not intended to replace advice given to you by your health care provider. Make sure you discuss any questions you have with your health care provider. Document Revised: 09/13/2019 Document Reviewed: 09/13/2019 Elsevier Patient Education  2023 [employer]  

## 2022-09-10 ENCOUNTER — Ambulatory Visit: Payer: 59 | Admitting: Obstetrics and Gynecology

## 2022-09-10 ENCOUNTER — Encounter: Payer: Self-pay | Admitting: Obstetrics and Gynecology

## 2022-09-10 VITALS — BP 124/82 | HR 73 | Ht 65.0 in | Wt 195.0 lb

## 2022-09-10 DIAGNOSIS — Z800 Family history of malignant neoplasm of digestive organs: Secondary | ICD-10-CM | POA: Diagnosis not present

## 2022-09-10 DIAGNOSIS — Z803 Family history of malignant neoplasm of breast: Secondary | ICD-10-CM

## 2022-09-10 DIAGNOSIS — Z841 Family history of disorders of kidney and ureter: Secondary | ICD-10-CM | POA: Diagnosis not present

## 2022-09-10 DIAGNOSIS — R102 Pelvic and perineal pain: Secondary | ICD-10-CM

## 2022-09-10 NOTE — Patient Instructions (Signed)
Proteinuria Proteinuria is when there is too much protein in the urine. Proteins help build muscles and bones. They are also needed to fight infections, help the blood clot, and keep body fluids in balance. Too much protein may be a sign of a problem with the kidneys. The kidneys make urine. They also help keep substances like proteins from leaving the blood and ending up in the urine. In some cases, proteinuria may be mild and short-term. In other cases, it may be an early sign of kidney disease. What are the causes? This condition may be caused by kidney damage or stress on the kidneys. Kidney damage Healthy kidneys have filters called glomeruli that keep proteins out of the urine. This condition can happen when the glomeruli are damaged. This may be from: Diabetes. High blood pressure. Injuries or poisons (toxins). Other causes of kidney damage include: Diseases that affect the body's defense system (immune system). These include lupus, rheumatoid arthritis, sarcoidosis, and Goodpasture syndrome. Infections in the kidney or bloodstream. Problems in other parts of the body that may injure the kidney. These include heart disease. Certain cancers. These include kidney cancer, lymphoma, leukemia, and multiple myeloma. Amyloidosis. This is a disease that causes too many proteins to build up in body tissues. High blood pressure during pregnancy (preeclampsia and eclampsia). Certain medicines. These include NSAIDs, such as ibuprofen. Stress on the kidneys Short-term proteinuria may be caused by conditions that put stress on the kidneys. In most cases, these conditions do not cause kidney damage. They include: Fever. Exposure to cold or heat. Emotional or physical stress. Extreme exercise. Standing for long periods of time. What increases the risk? You are more likely to develop this condition if: You have certain conditions. These include diabetes, high blood pressure, and heart disease. You  have an immune disease, cancer, or other disease that affects your kidneys. You have a family history of kidney disease. You are 59 years of age or older. You are overweight. You are pregnant. You have an infection. What are the signs or symptoms? Mild proteinuria may not cause symptoms. As more proteins enter the urine, symptoms of kidney disease may develop. These may include: Foamy urine. You may also have to urinate more often. Swelling of the face, abdomen, hands, legs, or feet (edema). Sleeping issues or tiredness (fatigue). Dry and itchy skin. Nausea and vomiting. Muscle cramps. Shortness of breath. How is this diagnosed? This condition may be diagnosed with a urine test. You may have this test as part of a routine physical exam. You may also have this test because you have symptoms of kidney disease or risk factors for the disease. You may also have: Blood tests to measure the level of a substance called creatinine in your blood. Levels of creatinine increase with kidney disease. Imaging tests of your kidney, such as a CT scan or ultrasound. These may be done to look for signs of kidney damage. How is this treated? If have mild or short-term proteinuria, you may not need treatment. Your health care provider may show you how to monitor the level of protein in your urine at home. Treatment depends on the cause of your condition. Treatment may include: Changes to your diet and lifestyle. Getting your blood pressure under control. Getting blood sugar under control, if you have diabetes. Managing other conditions you have that affect your kidneys. Giving birth, if you are pregnant. Staying away from medicines that damage your kidneys. In severe cases, kidney disease may need to be treated  with medicines or dialysis. Dialysis is when a machine is used to do the job of the kidneys. Follow these instructions at home: Eating and drinking Follow instructions from your health care  provider about what you may eat and drink. Get to, and maintain, a healthy weight. Ask your health care provider about diets that can help. Activity Ask your health care provider what exercise program is best for you. Return to your normal activities as told by your health care provider. Ask your health care provider what activities are safe for you. General instructions Take over-the-counter and prescription medicines only as told by your health care provider. Check your protein levels at home as told by your health care provider. Keep all follow-up visits. Your health care provider will monitor your kidneys. Contact a health care provider if: You have new symptoms. Your symptoms get worse or do not get better. You have back or side pain. You are vomiting or have diarrhea, and you cannot eat or drink anything. You have a fever. Get help right away if: You have shortness of breath or chest pain. These symptoms may be an emergency. Get help right away. Call 911. Do not wait to see if the symptoms will go away. Do not drive yourself to the hospital. This information is not intended to replace advice given to you by your health care provider. Make sure you discuss any questions you have with your health care provider. Document Revised: 12/05/2021 Document Reviewed: 12/05/2021 Elsevier Patient Education  2023 [employer].

## 2022-09-10 NOTE — Progress Notes (Signed)
GYNECOLOGY  VISIT   HPI: 57 y.o.   Married  Caucasian  female   [phone] with Patient's last menstrual period was 06/01/2002 (lmp unknown).   here for  abdominal pain. Pt had elevated protein levels in urine for years. Pt had CT scan about a month ago (everything was normal). Pt has referral for nephrology.  Appointment next week.  Pt has had mostly abdominal pain on the left side, but some on the right, occurring for the last month. It is not constant but consistent.    Nothing makes the pain better or worse.  Not related bowel function.  BMs usually once a day.   No pain with urination.   CT scan 08/18/21 report reviewed:  showed normal kidneys, no adnexal masses, normal pancreas.  Brother is on dialysis.  Patient states her mother had kidney disease and almost had dialysis treatment.   Pelvic US in 2021 was not able to detect her ovaries.   Lost 50 pounds intentionally.  Currently off Saxenda.   GYNECOLOGIC HISTORY: Patient's last menstrual period was 06/01/2002 (lmp unknown). Contraception:  hysterectomy Menopausal hormone therapy:  n/a Last mammogram:  04/01/22 Breast Density Cat A, BI-RADS CAT 1 neg Last pap smear:   2010 neg        OB History     Gravida  3   Para  3   Term  3   Preterm      AB      Living  3      SAB      IAB      Ectopic      Multiple      Live Births                 Patient Active Problem List   Diagnosis Date Noted   Vitamin D deficiency 11/14/2021   Osteoarthritis of both feet 11/14/2021   Prediabetes 03/21/2021   Venous stasis dermatitis of both lower extremities 09/02/2017    Past Medical History:  Diagnosis Date   Allergy    Anxiety    Breast mass    Childhood asthma    no problems as an adult   Depression    Dyspareunia    Hx of adenomatous colonic polyps    Hypercholesteremia    Thyroid disease    Vasculitis     Past Surgical History:  Procedure Laterality Date   ABDOMINAL HYSTERECTOMY  2004    TVH--still has ovaries   BLADDER SUSPENSION     breast biopsy Left 12/2014   fibroma   BREAST BIOPSY Left 2016   benign   BREAST SURGERY  12/2014   Benign Lt.breast bx   CARPAL TUNNEL RELEASE Bilateral 2017   CHOLECYSTECTOMY  2015   COLONOSCOPY  02/11/2015   [facility] Never - hx polyps   FOOT FRACTURE SURGERY Right 11/2013   MOUTH SURGERY     gum graft x 4   REFRACTIVE SURGERY      Current Outpatient Medications  Medication Sig Dispense Refill   Cholecalciferol (VITAMIN D3) 2000 UNITS capsule Take 2,000 Units by mouth daily.     fexofenadine (ALLEGRA) 180 MG tablet Take 180 mg by mouth daily.     levothyroxine (SYNTHROID, LEVOTHROID) 50 MCG tablet Take 50 mcg by mouth daily.     nystatin (MYCOSTATIN/NYSTOP) powder Apply 1 Application topically 3 (three) times daily. Apply to affected area for up to 7 days 15 g 2   rosuvastatin (CRESTOR) 20 MG tablet Take  20 mg by mouth at bedtime.     Liraglutide -Weight Management (SAXENDA) 18 MG/3ML SOPN Inject 0.6 mg into the skin for 1 week, increase by 0.6 mg weekly until reach 3 mg (Patient not taking: Reported on 09/10/2022) 15 mL 3   No current facility-administered medications for this visit.     ALLERGIES: Covid-19 (mrna) vaccine, Venlafaxine, and Erythromycin  Family History  Problem Relation Age of Onset   Diabetes Mother        borderline   Hypertension Mother    Thyroid disease Mother    Dementia Mother    Heart failure Brother    Hypertension Brother    Heart failure Brother    Pancreatic cancer Father    Colon polyps Sister [id number]       precancer   Asthma Brother    Breast cancer Paternal Aunt [id number]       lumpectomy and survived   Colon cancer Neg Hx    Rectal cancer Neg Hx    Stomach cancer Neg Hx     Social History   Socioeconomic History   Marital status: Married    Spouse name: Not on file   Number of children: Not on file   Years of education: Not on file   Highest education level: Not on file  Occupational  History   Not on file  Tobacco Use   Smoking status: Former    Years: 20    Types: Cigarettes    Quit date: 06/01/2009    Years since quitting: 13.2   Smokeless tobacco: Never   Tobacco comments:    socially  Vaping Use   Vaping Use: Never used  Substance and Sexual Activity   Alcohol use: Yes    Comment: 2 glasses of wine/month   Drug use: No   Sexual activity: Yes    Partners: Male    Birth control/protection: Surgical    Comment: hysterectomy  Other Topics Concern   Not on file  Social History Narrative   Not on file   Social Determinants of Health   Financial Resource Strain: Not on file  Food Insecurity: Not on file  Transportation Needs: Not on file  Physical Activity: Not on file  Stress: Not on file  Social Connections: Not on file  Intimate Partner Violence: Not on file    Review of Systems  Genitourinary:  Positive for pelvic pain.  All other systems reviewed and are negative.   PHYSICAL EXAMINATION:    BP 124/82 (BP Location: Left Arm, Patient Position: Sitting, Cuff Size: Normal)   Pulse 73   Ht 5\' 5"  (1.651 m)   Wt 195 lb (88.5 kg)   LMP 06/01/2002 (LMP Unknown)   SpO2 100%   BMI 32.45 kg/m     General appearance: alert, cooperative and appears stated age   Pelvic: External genitalia:  no lesions              Urethra:  normal appearing urethra with no masses, tenderness or lesions              Bartholins and Skenes: normal                 Vagina: normal appearing vagina with normal color and discharge, no lesions,  atrophy noted.              Cervix: absent                Bimanual Exam:  Uterus:  absent  Adnexa: no mass, fullness, tenderness              Rectal exam: yes.  Confirms.              Anus:  normal sphincter tone, no lesions  Chaperone was present for exam:  Warren Lacy, CMA  ASSESSMENT  Pelvic pain.  Uncertain etiology.  No adnexal masses on recent CT scan.  Status post TVH and probably midurethral sling. Still has  ovaries.   FH pancreatic and breast cancer.  FH renal disease.   PLAN  CT scan reviewed.  Patient declines pelvic US, and I am in agreement with this choice.  Consider Colace stool softener 100 mg 2 - 3 nights per week. She will see nephrology. Referral for genetics counseling and testing.  FU for annual exam and prn.     30 min  total time was spent for this patient encounter, including preparation, face-to-face counseling with the patient, coordination of care, and documentation of the encounter.

## 2022-11-02 ENCOUNTER — Telehealth: Payer: Self-pay | Admitting: Genetic Counselor

## 2022-11-05 ENCOUNTER — Other Ambulatory Visit: Payer: 59

## 2022-11-05 ENCOUNTER — Encounter: Payer: 59 | Admitting: Genetic Counselor

## 2022-12-10 ENCOUNTER — Other Ambulatory Visit: Payer: Self-pay | Admitting: Genetic Counselor

## 2022-12-10 ENCOUNTER — Inpatient Hospital Stay: Payer: 59 | Admitting: Genetic Counselor

## 2022-12-10 ENCOUNTER — Inpatient Hospital Stay: Payer: 59

## 2022-12-10 DIAGNOSIS — Z8042 Family history of malignant neoplasm of prostate: Secondary | ICD-10-CM

## 2022-12-10 DIAGNOSIS — Z801 Family history of malignant neoplasm of trachea, bronchus and lung: Secondary | ICD-10-CM

## 2022-12-10 DIAGNOSIS — Z803 Family history of malignant neoplasm of breast: Secondary | ICD-10-CM | POA: Diagnosis not present

## 2022-12-10 DIAGNOSIS — Z800 Family history of malignant neoplasm of digestive organs: Secondary | ICD-10-CM | POA: Diagnosis not present

## 2022-12-10 DIAGNOSIS — Z8051 Family history of malignant neoplasm of kidney: Secondary | ICD-10-CM

## 2022-12-10 LAB — GENETIC SCREENING ORDER

## 2022-12-13 ENCOUNTER — Encounter: Payer: Self-pay | Admitting: Genetic Counselor

## 2022-12-13 NOTE — Progress Notes (Signed)
REFERRING PROVIDER: Patton Salles, MD [address] [place],  [place] [address]  PRIMARY PROVIDER:  None listed  PRIMARY REASON FOR VISIT:  1. Family history of pancreatic cancer   2. Family history of colon cancer   3. Family history of breast cancer   4. Family history of prostate cancer     HISTORY OF PRESENT ILLNESS:   [name], a 57 y.o. female, was seen for a [facility] cancer genetics consultation at the request of Dr. Ardell Isaacs due to a family history of pancreatic and other cancers.  [name] presents to clinic today to discuss the possibility of a hereditary predisposition to cancer, to discuss genetic testing, and to further clarify her future cancer risks, as well as potential cancer risks for family members.   [name] is a 57 y.o. female with no personal history of cancer.    CANCER HISTORY:  Oncology History   No history exists.    RISK FACTORS:  Mammogram within the last year: yes; 04/2022; category A density Breast biopsy 2016.  Benign per pt.  Colonoscopy: yes;  most recent 2021 . Hysterectomy: yes.  Ovaries intact: yes.  Menarche was at age 55.  First live birth at age 15.   OCP use for approximately  15  years.  HRT use: 0 years.   Past Medical History:  Diagnosis Date   Allergy    Anxiety    Breast mass    Childhood asthma    no problems as an adult   Depression    Dyspareunia    Hx of adenomatous colonic polyps    Hypercholesteremia    Thyroid disease    Vasculitis (HCC)     Past Surgical History:  Procedure Laterality Date   ABDOMINAL HYSTERECTOMY  2004   TVH--still has ovaries   BLADDER SUSPENSION     breast biopsy Left 12/2014   fibroma   BREAST BIOPSY Left 2016   benign   BREAST SURGERY  12/2014   Benign Lt.breast bx   CARPAL TUNNEL RELEASE Bilateral 2017   CHOLECYSTECTOMY  2015   COLONOSCOPY  02/11/2015   [facility] Never - hx polyps   FOOT FRACTURE SURGERY Right 11/2013   MOUTH SURGERY      gum graft x 4   REFRACTIVE SURGERY       FAMILY HISTORY:  We obtained a detailed, 4-generation family history.  Significant diagnoses are listed below: Family History  Problem Relation Age of Onset   Pancreatic cancer Father 31       d. 37   Colon polyps Sister 47       precancer; unknown #; pat half sister   Lung cancer Maternal Aunt 45   Kidney cancer Maternal Uncle 75   Colon cancer Maternal Uncle 65   Prostate cancer Maternal Uncle 60   Colon cancer Maternal Uncle 69   Breast cancer Paternal Aunt 76       lumpectomy   Melanoma Paternal Aunt 38       arm   Cancer Other        PGM's sister; unk type; d. 70s   Breast cancer Cousin        mat female cousin; dx 56s   Bone cancer Cousin 72       mat female cousin   Skin cancer Cousin 8       mat female cousin; unknown type    [name] is unaware of  previous family history of genetic testing for hereditary cancer risks. Other relatives are unavailable for genetic testing at this time.   There is no reported Ashkenazi Jewish ancestry. There is no known consanguinity.  GENETIC COUNSELING ASSESSMENT: [name] is a 57 y.o. female with a family history which is somewhat suggestive of a hereditary cancer syndrome and predisposition to cancer given the presence of related cancers in her family (pancreatic, breast, melanoma, etc.). We, therefore, discussed and recommended the following at today's visit.   DISCUSSION: We discussed that 5 - 10% of cancer is hereditary, with most cases of hereditary breast and pancreatic cancer associated with BRCA1/2.  There are other genes that can be associated with hereditary cancer syndromes.  We discussed that testing is beneficial for several reasons, including knowing about other cancer risks, identifying potential screening and risk-reduction options that may be appropriate, and to understanding if other family members could be at risk for cancer and allowing them to undergo genetic testing.  We  reviewed the characteristics, features and inheritance patterns of hereditary cancer syndromes. We also discussed genetic testing, including the appropriate family members to test, the process of testing, insurance coverage and turn-around-time for results. We discussed the implications of a negative, positive, and variant of uncertain significant result. We discussed that negative results would be uninformative given that [name] does not have a personal history of cancer. We recommended [name] pursue genetic testing for a panel that contains genes associated with breast, pancreatic, colon, kidney, and thyroid cancers.  The CancerNext-Expanded gene panel offered by [facility] and includes sequencing, rearrangement, and RNA analysis for the following 71 genes:  AIP, ALK, APC, ATM, BAP1, BARD1, BMPR1A, BRCA1, BRCA2, BRIP1, CDC73, CDH1, CDK4, CDKN1B, CDKN2A, CHEK2, DICER1, FH, FLCN, KIF1B, LZTR1,MAX, MEN1, MET, MLH1, MSH2, MSH6, MUTYH, NF1, NF2, NTHL1, PALB2, PHOX2B, PMS2, POT1, PRKAR1A, PTCH1, PTEN, RAD51C,RAD51D, RB1, RET, SDHA, SDHAF2, SDHB, SDHC, SDHD, SMAD4, SMARCA4, SMARCB1, SMARCE1, STK11, SUFU, TMEM127, TP53,TSC1, TSC2 and VHL (sequencing and deletion/duplication); AXIN2, CTNNA1, EGFR, EGLN1, HOXB13, KIT, MITF, MSH3, PDGFRA, POLD1 and POLE (sequencing only); EPCAM and GREM1 (deletion/duplication only).   Based on [name] family history of pancreatic cancer in her deceased father, she meets medical criteria for genetic testing. Despite that she meets criteria, she may still have an out of pocket cost. We discussed that if her out of pocket cost for testing is over $100, the laboratory should contact them to discuss self-pay options and/or patient pay assistance programs.   We discussed the Genetic Information Non-Discrimination Act (GINA) of 2008, which helps protect individuals against genetic discrimination based on their genetic test results.  It impacts both health insurance and employment.   With health insurance, it protects against genetic test results being used for increased premiums or policy termination. For employment, it protects against hiring, firing and promoting decisions based on genetic test results.  GINA does not apply to those in the [employer], those who work for companies with less than 15 employees, and new life insurance or long-term disability insurance policies.  Health status due to a cancer diagnosis is not protected under GINA.  PLAN: After considering the risks, benefits, and limitations, [name] provided informed consent to pursue genetic testing and the blood sample was sent to [facility] for analysis of the CancerNext-Expanded +RNAinsight Panel. Results should be available within approximately 3 weeks' time, at which point they will be disclosed by telephone to [name], as will any additional recommendations warranted by these results. [name]  will receive a summary of her genetic counseling visit and a copy of her results once available. This information will also be available in Epic.   [name] questions were answered to her satisfaction today. Our contact information was provided should additional questions or concerns arise. Thank you for the referral and allowing [place] to share in the care of your patient.   Filemon Breton M. Rennie Plowman, MS, [facility] Genetic Counselor [email or web address] (P) [phone]   The patient was seen for a total of 40 minutes in face-to-face genetic counseling. The patient was seen alone.  Drs. Pamelia Hoit and/or Mosetta Putt were available to discuss this case as needed.  _______________________________________________________________________ For Office Staff:  Number of people involved in session: 1 Was an Intern/ student involved with case: yes; [employer] GC intern Maddy--intake/FH, education

## 2022-12-24 ENCOUNTER — Encounter: Payer: Self-pay | Admitting: Genetic Counselor

## 2022-12-24 ENCOUNTER — Ambulatory Visit: Payer: Self-pay | Admitting: Genetic Counselor

## 2022-12-24 ENCOUNTER — Telehealth: Payer: Self-pay | Admitting: Genetic Counselor

## 2022-12-24 DIAGNOSIS — Z1501 Genetic susceptibility to malignant neoplasm of breast: Secondary | ICD-10-CM

## 2022-12-24 DIAGNOSIS — Z8042 Family history of malignant neoplasm of prostate: Secondary | ICD-10-CM

## 2022-12-24 DIAGNOSIS — Z1379 Encounter for other screening for genetic and chromosomal anomalies: Secondary | ICD-10-CM | POA: Insufficient documentation

## 2022-12-24 DIAGNOSIS — Z803 Family history of malignant neoplasm of breast: Secondary | ICD-10-CM

## 2022-12-24 DIAGNOSIS — Z800 Family history of malignant neoplasm of digestive organs: Secondary | ICD-10-CM

## 2022-12-24 NOTE — Progress Notes (Signed)
GENETIC TEST RESULTS  Patient Name: Holly Mcdaniel Patient Age: 57 y.o. Encounter Date: 12/24/2022  Referring Provider: Conley Simmonds, MD   Ms. Tenpas was seen in the Cancer Genetics clinic on December 10, 2022 due to a family history of breast, pancreatic, colon, and other cancers and concern regarding a hereditary predisposition to cancer in the family. Please refer to the prior Genetics clinic note for more information regarding Ms. Jobst's medical and family histories and our assessment at the time.   FAMILY HISTORY:  We obtained a detailed, 4-generation family history.  Significant diagnoses are listed below:      Family History  Problem Relation Age of Onset   Pancreatic cancer Father 26        d. 42   Colon polyps Sister 43        precancer; unknown #; pat half sister   Lung cancer Maternal Aunt 45   Kidney cancer Maternal Uncle 41   Colon cancer Maternal Uncle 65   Prostate cancer Maternal Uncle 60   Colon cancer Maternal Uncle 69   Breast cancer Paternal Aunt 76        lumpectomy   Melanoma Paternal Aunt 39        arm   Cancer Other          PGM's sister; unk type; d. 89s   Breast cancer Cousin          mat female cousin; dx 53s   Bone cancer Cousin 55        mat female cousin   Skin cancer Cousin 69        mat female cousin; unknown type     Ms. Ayub is unaware of previous family history of genetic testing for hereditary cancer risks. Other relatives are unavailable for genetic testing at this time.    There is no reported Ashkenazi Jewish ancestry. There is no known consanguinity.  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  No other pathogenic variants were detected in the [facility] CancerNext-Expanded +RNAinsight Panel.  The CancerNext-Expanded gene panel offered by [facility] and includes sequencing, rearrangement, and RNA analysis for the following 71  genes:  AIP, ALK, APC, ATM, BAP1, BARD1, BMPR1A, BRCA1, BRCA2, BRIP1, CDC73, CDH1, CDK4, CDKN1B, CDKN2A, CHEK2, DICER1, FH, FLCN, KIF1B, LZTR1,MAX, MEN1, MET, MLH1, MSH2, MSH6, MUTYH, NF1, NF2, NTHL1, PALB2, PHOX2B, PMS2, POT1, PRKAR1A, PTCH1, PTEN, RAD51C,RAD51D, RB1, RET, SDHA, SDHAF2, SDHB, SDHC, SDHD, SMAD4, SMARCA4, SMARCB1, SMARCE1, STK11, SUFU, TMEM127, TP53,TSC1, TSC2 and VHL (sequencing and deletion/duplication); AXIN2, CTNNA1, EGFR, EGLN1, HOXB13, KIT, MITF, MSH3, PDGFRA, POLD1 and POLE (sequencing only); EPCAM and GREM1 (deletion/duplication only).   The test report has been scanned into EPIC and is located under the Molecular Pathology section of the Results Review tab.  A portion of the result report is included below for reference. Genetic testing reported out on December 21, 2022.      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  At this time, it is unknown if this variant is associated with increased cancer risk or if this is a normal finding, but most variants such as this get reclassified to being inconsequential. It should not be used to make medical management decisions. With time, we suspect the lab will determine the significance of this variant, if any. If we do learn more about it,  we will try to contact Ms. Rosenzweig to discuss it further. However, it is important to stay in touch with [place] periodically and keep the address and phone number up to date.   Cancer Risks for ATM: Women have a 20-30% lifetime risk of breast cancer. 2-3% risk for epithelial ovarian cancer 5-10% risk for pancreatic cancer  There is emerging evidence suggesting an increased risk for prostate cancer.  Research is continuing to help learn more about the cancers associated with ATM pathogenic variants and what the exact risks are to develop these cancers.   Management Recommendations:  Breast Screening/Risk Reduction: Breast cancer  screening includes: Breast awareness beginning at age 62 Monthly self-breast examination beginning at age 49 Clinical breast examination every 6-12 months beginning at age 34 or at the age of the earliest diagnosed breast cancer in the family, if onset was before age 83 Annual mammogram with consideration of tomosynthesis starting at age 21 or 10 years prior to the youngest age of diagnosis, whichever comes first Consider annual breast MRI with and without contrast starting at age 76-35 Evidence is insufficient for a prophylactic risk-reducing mastectomy, manage based on family history  For patients who are treated for breast cancer and have not had bilateral mastectomy, screening should continue as described  Ovarian Cancer Screening/Risk Reduction: Evidence insufficient for risk-reducing salpingo oophorectomy; manage based on family history Seek prompt evaluation with onset of signs/symptoms related to ovarian cancer   Pancreatic Cancer Screening/Risk Reduction: Avoid smoking, heavy alcohol use, and obesity. Pancreatic cancer screening may be considered in those with a family history of pancreatic cancer (first- or second-degree relative). Screening includes annual endoscopic ultrasound (preferred) and/or MRI of the pancreas starting at age 105 or 34 years younger than the earliest age diagnosis in the family.   Prostate Cancer Screening: Consider beginning annual PSA blood test and digital rectal exams at age 7.  Additional considerations: There is insufficient evidence to recommend against radiation therapy.  Individuals with a single pathogenic ATM variant are also carriers of ataxia telangiectasia. Ataxia telangiectasia is associated with childhood cancer risks as well as other medical problems (such as difficulty with movement, balance and coordination problems, neuropathy, and weakened immunity). For there to be a risk of ataxia telangiectasia in offspring, both the patient and their  partner would each have to carry a pathogenic variant in ATM; in this case, the risk to have an affected child is 25%.  This information is based on current understanding of the gene and may change in the future.  Implications for Family Members: Hereditary predisposition to cancer due to pathogenic variants in the ATM gene has autosomal dominant inheritance. This means that first degree relatives (children, full siblings, parents) have a 50% chance of also having the ATM gene mutation.  More distant relatives also have an increased chance of having the ATM gene mutation. Identification of a pathogenic variant allows for the recognition of at-risk relatives who can pursue testing for the familial variant.   Family members are encouraged to consider genetic testing for this familial pathogenic variant. As there are generally no childhood cancer risks associated with pathogenic variants in the ATM gene, individuals in the family are not recommended to have testing until they reach at least 59 years of age. They may contact our office at [phone] for more information or to schedule an appointment. Complimentary testing for the familial variant is available for 90 days from the report date (12/21/2022). Family members who live outside of the area are  encouraged to find a genetic counselor in their area by visiting: [email or web address].  Resources: FORCE (Facing Our Risk of Cancer Empowered) is a resource for those with a hereditary predisposition to develop cancer.  FORCE provides information about risk reduction, advocacy, legislation, and clinical trials.  Additionally, FORCE provides a platform for collaboration and support; which includes: peer navigation, message boards, local support groups, a toll-free helpline, research registry and recruitment, advocate training, published medical research, webinars, brochures, mastectomy photos, and more.  For more information, visit  www.facingourrisk.org  Plan:  Breast cancer risk: Encouraged Ms. Ravert to speak with Dr. Edward Jolly about considering annual breast MRIs in addition to annual mammograms.  She declined referral to high risk breast clinic at this time.  Pancreatic cancer risk: Recommended consideration for pancreatic cancer screening given ATM gene mutation and her father's history of pancreatic cancer in his early 35s.  Patient last seen by [facility] in 2021.  Message sent to Dr. Barron Alvine requesting  GI reach out to schedule pancreatic cancer screening consultation.  Ovarian cancer risk: Ovaries are intact.  No ovarian cancer risk management changes recommended based on ATM gene mutation alone.  Encouraged her to seek prompt evaluation in setting of signs/symptoms of ovarian cancer.  Family letter provided to encourage family testing.    We encouraged Ms. Karaffa to remain in contact with [place] on a regular basis so we can update her personal and family histories, and let her know of advances in cancer genetics that may benefit the family. Our contact number was provided. Ms. Mccormick questions were answered to her satisfaction today, and she knows she is welcome to call anytime with additional questions.   Morgen Linebaugh M. Rennie Plowman, MS, [facility] Genetic Counselor [email or web address] (P) [phone]

## 2022-12-24 NOTE — Telephone Encounter (Signed)
Disclosed pathogenic variant in ATM gene and VUS in FLCN.  Discussed cancer risks/management/family implications of ATM gene mutation.  Detailed clinic note to follow.

## 2022-12-28 ENCOUNTER — Telehealth: Payer: Self-pay

## 2022-12-28 ENCOUNTER — Encounter: Payer: Self-pay | Admitting: Gastroenterology

## 2022-12-28 NOTE — Telephone Encounter (Signed)
-----   Message from Shellia Cleverly sent at 12/25/2022  4:57 PM EDT ----- Can you please schedule this patient an appointment with me for next routine available to discuss Pancreatic Cancer screening.  Thanks. ----- Message ----- From: Christiane Ha Sent: 12/24/2022  11:24 AM EDT To: Shellia Cleverly, DO  Hi Dr. Barron Alvine,   This patient has an ATM gene mutation and a FH pancreatic cancer in her father.  She last saw Dr. Orvan Falconer in 2021 for a colonoscopy.  She is interested in a consultation at [facility] for considering panc screening.  Could you please ask your team reach out to her to set something up?    I appreciate it!

## 2022-12-28 NOTE — Telephone Encounter (Signed)
Called and LVM to the patient to inform that she is scheduled for an appointment with Dr. Barron Alvine on 04/01/23 at 9:20 AM to discuss Pancreatic cancer screening . MyChart message sent as well.

## 2022-12-28 NOTE — Telephone Encounter (Signed)
PT returned call. Advised of the appointment. Sent OV information

## 2022-12-29 ENCOUNTER — Encounter: Payer: Self-pay | Admitting: Obstetrics and Gynecology

## 2023-01-04 NOTE — Progress Notes (Signed)
GYNECOLOGY  VISIT   HPI: 57 y.o.   Married  Caucasian  female   [phone] with Patient's last menstrual period was 06/01/2002 (lmp unknown).   here for review of genetic testing and scheduling breast MRI.  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 She is at increased risk of breast and pancreatic cancer.   She will see her GI next week.   Work is busy and stressful.  Brother passed away.   GYNECOLOGIC HISTORY: Patient's last menstrual period was 06/01/2002 (lmp unknown). Contraception:  hyst Menopausal hormone therapy:  n/a Last mammogram:  04/01/22 Breast Density Cat A, BI-RADS CAT 1 neg  Last pap smear:   2010 neg        OB History     Gravida  3   Para  3   Term  3   Preterm      AB      Living  3      SAB      IAB      Ectopic      Multiple      Live Births                 Patient Active Problem List   Diagnosis Date Noted   ATM gene mutation positive 12/24/2022   Genetic testing 12/24/2022   Vitamin D deficiency 11/14/2021   Osteoarthritis of both feet 11/14/2021   Prediabetes 03/21/2021   Venous stasis dermatitis of both lower extremities 09/02/2017    Past Medical History:  Diagnosis Date   Allergy    Anxiety    ATM gene mutation positive    Breast mass    Childhood asthma    no problems as an adult   Depression    Dyspareunia    Hx of adenomatous colonic polyps    Hypercholesteremia    Thyroid disease    Vasculitis (HCC)     Past Surgical History:  Procedure Laterality Date   ABDOMINAL HYSTERECTOMY  2004   TVH--still has ovaries   BLADDER SUSPENSION     breast biopsy Left 12/2014   fibroma   BREAST BIOPSY Left 2016   benign   BREAST SURGERY  12/2014   Benign Lt.breast bx   CARPAL TUNNEL RELEASE Bilateral 2017   CHOLECYSTECTOMY  2015   COLONOSCOPY  02/11/2015   [facility] Never - hx polyps   FOOT FRACTURE SURGERY Right 11/2013   MOUTH SURGERY     gum graft x 4   REFRACTIVE SURGERY      Current  Outpatient Medications  Medication Sig Dispense Refill   Cholecalciferol (VITAMIN D3) 2000 UNITS capsule Take 2,000 Units by mouth daily.     fexofenadine (ALLEGRA) 180 MG tablet Take 180 mg by mouth daily.     levothyroxine (SYNTHROID, LEVOTHROID) 50 MCG tablet Take 50 mcg by mouth daily.     lisinopril (ZESTRIL) 20 MG tablet Take 20 mg by mouth daily.     nystatin (MYCOSTATIN/NYSTOP) powder Apply 1 Application topically 3 (three) times daily. Apply to affected area for up to 7 days 15 g 2   rosuvastatin (CRESTOR) 20 MG tablet Take 20 mg by mouth at bedtime.     No current facility-administered medications for this visit.     ALLERGIES: Covid-19 (mrna) vaccine, Venlafaxine, and Erythromycin  Family History  Problem Relation Age of Onset   Diabetes Mother        borderline   Hypertension Mother    Thyroid disease Mother  Dementia Mother    Pancreatic cancer Father 56       d. 50   Colon polyps Sister 20       precancer; unknown #; pat half sister   Heart failure Brother    Hypertension Brother    Heart failure Brother    Asthma Brother    Lung cancer Maternal Aunt 45   Kidney cancer Maternal Uncle 40   Colon cancer Maternal Uncle 65   Prostate cancer Maternal Uncle 60   Colon cancer Maternal Uncle 69   Breast cancer Paternal Aunt 70       lumpectomy   Melanoma Paternal Aunt 81       arm   Breast cancer Cousin        mat female cousin; dx 85s   Bone cancer Cousin 15       mat female cousin   Skin cancer Cousin 45       mat female cousin; unknown type   Cancer Other        PGM's sister; unk type; d. 51s   Rectal cancer Neg Hx    Stomach cancer Neg Hx     Social History   Socioeconomic History   Marital status: Married    Spouse name: Not on file   Number of children: Not on file   Years of education: Not on file   Highest education level: Not on file  Occupational History   Not on file  Tobacco Use   Smoking status: Former    Current packs/day: 0.00     Types: Cigarettes    Start date: 06/01/1989    Quit date: 06/01/2009    Years since quitting: 13.6   Smokeless tobacco: Never   Tobacco comments:    socially  Vaping Use   Vaping status: Never Used  Substance and Sexual Activity   Alcohol use: Yes    Comment: 2 glasses of wine/month   Drug use: No   Sexual activity: Yes    Partners: Male    Birth control/protection: Surgical    Comment: hysterectomy  Other Topics Concern   Not on file  Social History Narrative   Not on file   Social Determinants of Health   Financial Resource Strain: Low Risk  (08/02/2022)   Received from [facility], [facility]   Overall Financial Resource Strain (CARDIA)    Difficulty of Paying Living Expenses: Not hard at all  Food Insecurity: No Food Insecurity (08/02/2022)   Received from [facility], [facility]   Hunger Vital Sign    Worried About Running Out of Food in the Last Year: Never true    Ran Out of Food in the Last Year: Never true  Transportation Needs: No Transportation Needs (08/02/2022)   Received from [facility], [facility]   PRAPARE - Transportation    Lack of Transportation (Medical): No    Lack of Transportation (Non-Medical): No  Physical Activity: Inactive (08/02/2022)   Received from [facility], [facility]   Exercise Vital Sign    Days of Exercise per Week: 0 days    Minutes of Exercise per Session: 30 min  Stress: Stress Concern Present (08/02/2022)   Received from [facility], [place] [facility] - Occupational Stress Questionnaire    Feeling of Stress : To some extent  Social Connections: Socially Integrated (08/02/2022)   Received from [facility], [facility]   Social Network    How would you rate  your social network (family, work, friends)?: Good participation with social networks  Intimate Partner Violence: Not At Risk (08/02/2022)   Received from [facility], [facility]   HITS    Over the last 12 months how  often did your partner physically hurt you?: 1    Over the last 12 months how often did your partner insult you or talk down to you?: 1    Over the last 12 months how often did your partner threaten you with physical harm?: 1    Over the last 12 months how often did your partner scream or curse at you?: 1    Review of Systems  All other systems reviewed and are negative.   PHYSICAL EXAMINATION:    BP 128/82 (BP Location: Left Arm, Patient Position: Sitting, Cuff Size: Normal)   Ht 5\' 5"  (1.651 m)   Wt 195 lb (88.5 kg)   LMP 06/01/2002 (LMP Unknown)   BMI 32.45 kg/m     General appearance: alert, cooperative and appears stated age  ASSESSMENT  ATM gene positive mutation.   PLAN  We discussed her ATM gene mutation and increased risk of breast and pancreatic cancer.  Some slightly increased risk of ovarian cancer, but no current guidelines for any additional intervention.  Will order breast MRI with contrast for additional breast cancer screening.   Referral to medical oncology.  Yearly pelvic US.  She will follow up with GI.  29 min  total time was spent for this patient encounter, including preparation, face-to-face counseling with the patient, coordination of care, and documentation of the encounter.

## 2023-01-18 ENCOUNTER — Ambulatory Visit: Payer: 59 | Admitting: Obstetrics and Gynecology

## 2023-01-18 ENCOUNTER — Encounter: Payer: Self-pay | Admitting: Obstetrics and Gynecology

## 2023-01-18 VITALS — BP 128/82 | Ht 65.0 in | Wt 195.0 lb

## 2023-01-18 DIAGNOSIS — Z1501 Genetic susceptibility to malignant neoplasm of breast: Secondary | ICD-10-CM

## 2023-01-18 DIAGNOSIS — Z1509 Genetic susceptibility to other malignant neoplasm: Secondary | ICD-10-CM

## 2023-01-23 ENCOUNTER — Telehealth: Payer: Self-pay | Admitting: Obstetrics and Gynecology

## 2023-01-23 DIAGNOSIS — Z1501 Genetic susceptibility to malignant neoplasm of breast: Secondary | ICD-10-CM

## 2023-01-23 NOTE — Telephone Encounter (Signed)
Please schedule breast MRI with contrast, make referral to Dr. Pamelia Hoit (medical oncology), and schedule ultrasound appointment at [facility].   My patient has ATM gene mutation.

## 2023-01-25 ENCOUNTER — Ambulatory Visit: Admission: RE | Admit: 2023-01-25 | Payer: 59 | Source: Ambulatory Visit

## 2023-01-25 DIAGNOSIS — Z1501 Genetic susceptibility to malignant neoplasm of breast: Secondary | ICD-10-CM

## 2023-01-25 NOTE — Telephone Encounter (Signed)
Orders placed for Breast MRI, PUS and referral.

## 2023-01-25 NOTE — Telephone Encounter (Signed)
Per review of EPIC, PUS scheduled 01/25/23, MRI on 02/17/23.   Routing to Dr. Marjorie Smolder.  Encounter closed.   Cc: Bianca for referral.

## 2023-01-29 ENCOUNTER — Other Ambulatory Visit (HOSPITAL_COMMUNITY): Payer: Self-pay

## 2023-01-29 MED ORDER — WEGOVY 0.5 MG/0.5ML ~~LOC~~ SOAJ
0.5000 mg | SUBCUTANEOUS | 0 refills | Status: DC
  Filled 2023-01-29: qty 2, 28d supply, fill #0

## 2023-01-29 MED ORDER — ONDANSETRON 4 MG PO TBDP
4.0000 mg | ORAL_TABLET | Freq: Four times a day (QID) | ORAL | 1 refills | Status: DC
  Filled 2023-01-29: qty 30, 8d supply, fill #0

## 2023-02-03 ENCOUNTER — Other Ambulatory Visit (HOSPITAL_COMMUNITY): Payer: Self-pay

## 2023-02-05 ENCOUNTER — Other Ambulatory Visit (HOSPITAL_COMMUNITY): Payer: Self-pay

## 2023-02-05 ENCOUNTER — Other Ambulatory Visit: Payer: Self-pay | Admitting: Obstetrics and Gynecology

## 2023-02-05 ENCOUNTER — Encounter: Payer: Self-pay | Admitting: Obstetrics and Gynecology

## 2023-02-05 MED ORDER — LORAZEPAM 1 MG PO TABS: ORAL_TABLET | ORAL | 0 refills | Status: DC

## 2023-02-05 NOTE — Progress Notes (Signed)
Rx for Ativan to take prior to upcoming MRI.

## 2023-02-05 NOTE — Telephone Encounter (Signed)
Last read by Leeanne Deed "Angie" at  3:24 PM on 02/05/2023.

## 2023-02-08 ENCOUNTER — Other Ambulatory Visit: Payer: Self-pay

## 2023-02-08 ENCOUNTER — Other Ambulatory Visit (HOSPITAL_COMMUNITY): Payer: Self-pay

## 2023-02-17 ENCOUNTER — Ambulatory Visit
Admission: RE | Admit: 2023-02-17 | Discharge: 2023-02-17 | Disposition: A | Discharge status: Home / Self Care | Payer: 59 | Source: Ambulatory Visit | Attending: Obstetrics and Gynecology | Admitting: Obstetrics and Gynecology

## 2023-02-17 DIAGNOSIS — Z1501 Genetic susceptibility to malignant neoplasm of breast: Secondary | ICD-10-CM

## 2023-02-17 MED ORDER — GADOPICLENOL 0.5 MMOL/ML IV SOLN
7.5000 mL | Freq: Once | INTRAVENOUS | Status: AC | PRN
  Administered 2023-02-17: 7.5 mL via INTRAVENOUS

## 2023-02-22 ENCOUNTER — Inpatient Hospital Stay: Payer: 59 | Attending: Hematology and Oncology | Admitting: Hematology and Oncology

## 2023-02-22 VITALS — BP 121/73 | HR 76 | Temp 97.9°F | Resp 17 | Wt 202.9 lb

## 2023-02-22 DIAGNOSIS — Z803 Family history of malignant neoplasm of breast: Secondary | ICD-10-CM | POA: Diagnosis not present

## 2023-02-22 DIAGNOSIS — Z800 Family history of malignant neoplasm of digestive organs: Secondary | ICD-10-CM | POA: Diagnosis not present

## 2023-02-22 DIAGNOSIS — R319 Hematuria, unspecified: Secondary | ICD-10-CM | POA: Diagnosis not present

## 2023-02-22 DIAGNOSIS — Z8051 Family history of malignant neoplasm of kidney: Secondary | ICD-10-CM | POA: Diagnosis not present

## 2023-02-22 DIAGNOSIS — Z1509 Genetic susceptibility to other malignant neoplasm: Secondary | ICD-10-CM | POA: Insufficient documentation

## 2023-02-22 DIAGNOSIS — Z87891 Personal history of nicotine dependence: Secondary | ICD-10-CM | POA: Insufficient documentation

## 2023-02-22 DIAGNOSIS — R809 Proteinuria, unspecified: Secondary | ICD-10-CM | POA: Diagnosis not present

## 2023-02-22 DIAGNOSIS — Z801 Family history of malignant neoplasm of trachea, bronchus and lung: Secondary | ICD-10-CM | POA: Insufficient documentation

## 2023-02-22 DIAGNOSIS — Z8042 Family history of malignant neoplasm of prostate: Secondary | ICD-10-CM | POA: Insufficient documentation

## 2023-02-22 DIAGNOSIS — N183 Chronic kidney disease, stage 3 unspecified: Secondary | ICD-10-CM | POA: Insufficient documentation

## 2023-02-22 DIAGNOSIS — Z808 Family history of malignant neoplasm of other organs or systems: Secondary | ICD-10-CM | POA: Insufficient documentation

## 2023-02-22 DIAGNOSIS — Z1501 Genetic susceptibility to malignant neoplasm of breast: Secondary | ICD-10-CM | POA: Insufficient documentation

## 2023-02-22 NOTE — Assessment & Plan Note (Signed)
Genetic testing [facility] 12/21/2022: ATM gene mutation [id number] ATM gene mutation: I discussed with the patient that having ATM gene mutation leads to 2-3 times increased risk of breast cancer which translates into a 20-30% lifetime risk of breast cancer. There is also risk of colon cancer and pancreatic cancer (5 to 10%). I recommended that she undergo colonoscopy every 5 years.  Referral to Dr. Brett Fairy for surveillance for colon and pancreatic cancers  For breast cancer surveillance: I offered the patient 2 options. active surveillance with mammogram and breast MRI every year versus contrast-enhanced mammography annually.

## 2023-02-22 NOTE — Progress Notes (Signed)
[facility] CONSULT NOTE  Patient Care Team: Algis Greenhouse as PCP - General (Family Medicine) Patton Salles, MD as Consulting Physician (Obstetrics and Gynecology)  CHIEF COMPLAINTS/PURPOSE OF CONSULTATION:  At high risk for breast cancer, ATM gene mutation  HISTORY OF PRESENTING ILLNESS:  Discussed the use of AI scribe software for clinical note transcription with the patient, who gave verbal consent to proceed.  History of Present Illness   [name], a patient with a known ATM gene mutation and Stage 3 chronic kidney disease, presents for a consultation. The patient was diagnosed with chronic kidney disease during a routine checkup when proteins and blood were found in her urine. The patient has a family history of kidney issues and cancer, including a father who had pancreatic cancer. The patient is currently seeing multiple specialists, including a nephrologist for her kidney disease, a rheumatologist, a gynecologist, and a gastroenterologist. The patient recently underwent a kidney biopsy and is awaiting the results. The patient has no known risk factors for kidney disease, such as diabetes or high blood pressure.      I reviewed her records extensively and collaborated the history with the patient.   MEDICAL HISTORY:  Past Medical History:  Diagnosis Date   Allergy    Anxiety    ATM gene mutation positive    Breast mass    Childhood asthma    no problems as an adult   Depression    Dyspareunia    Hx of adenomatous colonic polyps    Hypercholesteremia    Thyroid disease    Vasculitis (HCC)     SURGICAL HISTORY: Past Surgical History:  Procedure Laterality Date   ABDOMINAL HYSTERECTOMY  2004   TVH--still has ovaries   BLADDER SUSPENSION     breast biopsy Left 12/2014   fibroma   BREAST BIOPSY Left 2016   benign   BREAST SURGERY  12/2014   Benign Lt.breast bx   CARPAL TUNNEL RELEASE Bilateral 2017   CHOLECYSTECTOMY  2015    COLONOSCOPY  02/11/2015   [facility] Never - hx polyps   FOOT FRACTURE SURGERY Right 11/2013   MOUTH SURGERY     gum graft x 4   REFRACTIVE SURGERY      SOCIAL HISTORY: Social History   Socioeconomic History   Marital status: Married    Spouse name: Not on file   Number of children: Not on file   Years of education: Not on file   Highest education level: Not on file  Occupational History   Not on file  Tobacco Use   Smoking status: Former    Current packs/day: 0.00    Types: Cigarettes    Start date: 06/01/1989    Quit date: 06/01/2009    Years since quitting: 13.7   Smokeless tobacco: Never   Tobacco comments:    socially  Vaping Use   Vaping status: Never Used  Substance and Sexual Activity   Alcohol use: Yes    Comment: 2 glasses of wine/month   Drug use: No   Sexual activity: Yes    Partners: Male    Birth control/protection: Surgical    Comment: hysterectomy  Other Topics Concern   Not on file  Social History Narrative   Not on file   Social Determinants of Health   Financial Resource Strain: Low Risk  (01/28/2023)   Received from [facility]   Overall Financial Resource Strain (CARDIA)    Difficulty of Paying  Living Expenses: Not hard at all  Food Insecurity: No Food Insecurity (01/28/2023)   Received from [facility]   Hunger Vital Sign    Worried About Running Out of Food in the Last Year: Never true    Ran Out of Food in the Last Year: Never true  Transportation Needs: No Transportation Needs (01/28/2023)   Received from [facility] - Transportation    Lack of Transportation (Medical): No    Lack of Transportation (Non-Medical): No  Physical Activity: Inactive (01/28/2023)   Received from [facility]   Exercise Vital Sign    Days of Exercise per Week: 0 days    Minutes of Exercise per Session: 30 min  Stress: No Stress Concern Present (01/28/2023)   Received from [facility] - Occupational  Stress Questionnaire    Feeling of Stress : Not at all  Social Connections: Socially Integrated (01/28/2023)   Received from [facility]   Social Network    How would you rate your social network (family, work, friends)?: Good participation with social networks  Intimate Partner Violence: Not At Risk (01/28/2023)   Received from [facility]   HITS    Over the last 12 months how often did your partner physically hurt you?: 1    Over the last 12 months how often did your partner insult you or talk down to you?: 1    Over the last 12 months how often did your partner threaten you with physical harm?: 1    Over the last 12 months how often did your partner scream or curse at you?: 1    FAMILY HISTORY: Family History  Problem Relation Age of Onset   Diabetes Mother        borderline   Hypertension Mother    Thyroid disease Mother    Dementia Mother    Pancreatic cancer Father 37       d. 78   Colon polyps Sister 63       precancer; unknown #; pat half sister   Heart failure Brother    Hypertension Brother    Heart failure Brother    Asthma Brother    Lung cancer Maternal Aunt 45   Kidney cancer Maternal Uncle 4   Colon cancer Maternal Uncle 65   Prostate cancer Maternal Uncle 60   Colon cancer Maternal Uncle 69   Breast cancer Paternal Aunt 76       lumpectomy   Melanoma Paternal Aunt 50       arm   Breast cancer Cousin        mat female cousin; dx 28s   Bone cancer Cousin 85       mat female cousin   Skin cancer Cousin 31       mat female cousin; unknown type   Cancer Other        PGM's sister; unk type; d. 48s   Rectal cancer Neg Hx    Stomach cancer Neg Hx     ALLERGIES:  is allergic to covid-19 (mrna) vaccine, venlafaxine, and erythromycin.  MEDICATIONS:  Current Outpatient Medications  Medication Sig Dispense Refill   Cholecalciferol (VITAMIN D3) 2000 UNITS capsule Take 2,000 Units by mouth daily.     fexofenadine (ALLEGRA) 180 MG tablet Take 180 mg by mouth  daily.     levothyroxine (SYNTHROID, LEVOTHROID) 50 MCG tablet Take 50 mcg by mouth daily.     lisinopril (ZESTRIL) 20 MG  tablet Take 20 mg by mouth daily.     nystatin (MYCOSTATIN/NYSTOP) powder Apply 1 Application topically 3 (three) times daily. Apply to affected area for up to 7 days 15 g 2   ondansetron (ZOFRAN-ODT) 4 MG disintegrating tablet Dissolve 1 tablet (4 mg total) by mouth every 6 (six) hours. 30 tablet 1   rosuvastatin (CRESTOR) 20 MG tablet Take 20 mg by mouth at bedtime.     Semaglutide-Weight Management (WEGOVY) 0.5 MG/0.5ML SOAJ Inject 0.5 mg into the skin once a week for 28 days 2 mL 0   LORazepam (ATIVAN) 1 MG tablet Take one tablet (1 mg) 30 minutes prior to procedure. (Patient not taking: Reported on 02/22/2023) 2 tablet 0   No current facility-administered medications for this visit.    REVIEW OF SYSTEMS:   Constitutional: Denies fevers, chills or abnormal night sweats   All other systems were reviewed with the patient and are negative.  PHYSICAL EXAMINATION: ECOG PERFORMANCE STATUS: 0 - Asymptomatic  Vitals:   02/22/23 1550  BP: 121/73  Pulse: 76  Resp: 17  Temp: 97.9 F (36.6 C)  SpO2: 100%   Filed Weights   02/22/23 1550  Weight: 202 lb 14.4 oz (92 kg)    GENERAL:alert, no distress and comfortable      RADIOGRAPHIC STUDIES: I have personally reviewed the radiological reports and agreed with the findings in the report.  ASSESSMENT AND PLAN:  ATM gene mutation positive Genetic testing [facility] 12/21/2022: ATM gene mutation [id number] ATM gene mutation: I discussed with the patient that having ATM gene mutation leads to 2-3 times increased risk of breast cancer which translates into a 20-30% lifetime risk of breast cancer. There is also risk of colon cancer and pancreatic cancer (5 to 10%). I recommended that she undergo colonoscopy every 5 years.  Referral to Dr. Brett Fairy for surveillance for colon and pancreatic cancers  For breast cancer  surveillance: I offered the patient 2 options. active surveillance with mammogram and breast MRI every year versus contrast-enhanced mammography annually.  We will plan to do breast MRIs alternating with mammograms for a few years and then we can switch to a contrast-enhanced mammograms later on.  Assessment and Plan     Chronic Kidney Disease (Stage 3) Unexplained kidney disease with proteinuria and hematuria. Family history of kidney issues. Patient recently had a kidney biopsy, results pending. -Follow-up with nephrologist for biopsy results and further management.  Breast Density (Category A) Low breast density, indicating lower risk of breast cancer and high clarity of mammograms. -Continue with mammogram surveillance every 6 months.  Gastroenterology Consult Appointment scheduled with Dr. Barron Alvine on October 31st for further evaluation and management related to ATM gene mutation and associated increased risk of pancreatic and colon cancer. -Attend scheduled appointment and discuss preventative measures for colon and pancreas.  Follow-up Plan for annual follow-up virtually to review surveillance and management plan.       All questions were answered. The patient knows to call the clinic with any problems, questions or concerns.    Tamsen Meek, MD 02/22/23

## 2023-03-04 ENCOUNTER — Other Ambulatory Visit (HOSPITAL_COMMUNITY): Payer: Self-pay

## 2023-03-04 MED ORDER — WEGOVY 0.5 MG/0.5ML ~~LOC~~ SOAJ: 0.5000 mg | SUBCUTANEOUS | 0 refills | Status: DC

## 2023-03-08 ENCOUNTER — Other Ambulatory Visit (HOSPITAL_COMMUNITY): Payer: Self-pay

## 2023-03-08 MED ORDER — WEGOVY 0.5 MG/0.5ML ~~LOC~~ SOAJ
0.5000 mg | SUBCUTANEOUS | 0 refills | Status: DC
  Filled 2023-03-08: qty 2, 28d supply, fill #0

## 2023-03-10 ENCOUNTER — Other Ambulatory Visit (HOSPITAL_COMMUNITY): Payer: Self-pay

## 2023-03-10 MED ORDER — WEGOVY 0.5 MG/0.5ML ~~LOC~~ SOAJ
0.5000 mg | SUBCUTANEOUS | 0 refills | Status: DC
  Filled 2023-03-10: qty 2, 28d supply, fill #0

## 2023-03-17 ENCOUNTER — Other Ambulatory Visit (HOSPITAL_COMMUNITY): Payer: Self-pay

## 2023-03-18 ENCOUNTER — Other Ambulatory Visit (HOSPITAL_COMMUNITY): Payer: Self-pay

## 2023-04-01 ENCOUNTER — Ambulatory Visit: Payer: 59 | Admitting: Gastroenterology

## 2023-04-01 ENCOUNTER — Encounter: Payer: Self-pay | Admitting: Gastroenterology

## 2023-04-01 VITALS — BP 120/76 | HR 73 | Ht 66.0 in | Wt 203.0 lb

## 2023-04-01 DIAGNOSIS — Z860100 Personal history of colon polyps, unspecified: Secondary | ICD-10-CM

## 2023-04-01 DIAGNOSIS — Z1509 Genetic susceptibility to other malignant neoplasm: Secondary | ICD-10-CM

## 2023-04-01 DIAGNOSIS — F40240 Claustrophobia: Secondary | ICD-10-CM

## 2023-04-01 DIAGNOSIS — Z1501 Genetic susceptibility to malignant neoplasm of breast: Secondary | ICD-10-CM | POA: Diagnosis not present

## 2023-04-01 DIAGNOSIS — Z800 Family history of malignant neoplasm of digestive organs: Secondary | ICD-10-CM

## 2023-04-01 MED ORDER — DIAZEPAM 2 MG PO TABS: ORAL_TABLET | ORAL | 0 refills | Status: DC

## 2023-04-01 NOTE — Progress Notes (Signed)
Chief Complaint: ATM gene mutation, Pancreatic Cancer screening   Referring Provider:     Serena Croissant, MD    HPI:     Holly Mcdaniel is a 57 y.o. 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  Recently diagnosed with ATM gene mutation and was seen in the Hematology/Oncology clinic on 02/22/2023 and referred here to discuss PC screening protocol and modification to colon cancer screening to every 5 years due to elevated risk with ATM gene mutation patient's.  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    Hgb A1c was 5.7% in 12/2022. Normal LAEs and CBC at that time.   Endoscopic History: - 02/11/2015: Colonoscopy: 6 mm ascending colon polyp, 2 mm transverse colon polyp, 2 mm ascending colon polyp, 3-4 mm descending colon polyp, 2 small 2 mm descending colon polyps, small internal hemorrhoids.  No path available for review - 08/04/2019: Colonoscopy: Internal hemorrhoids, otherwise normal.     Past Medical History:  Diagnosis Date   Allergy    Anxiety    ATM gene mutation positive    Breast mass    Childhood asthma    no problems as an adult   Chronic kidney disease    Depression    Dyspareunia    Hx of adenomatous colonic polyps    Hypercholesteremia    Thyroid disease    Vasculitis (HCC)      Past Surgical History:  Procedure Laterality Date   ABDOMINAL HYSTERECTOMY  2004   TVH--still has ovaries   BLADDER SUSPENSION     breast biopsy Left 12/2014   fibroma   BREAST BIOPSY Left 2016   benign   BREAST SURGERY  12/2014   Benign Lt.breast bx   CARPAL TUNNEL RELEASE Bilateral 2017    CHOLECYSTECTOMY  2015   COLONOSCOPY  02/11/2015   [facility] Never - hx polyps   FOOT FRACTURE SURGERY Right 11/2013   MOUTH SURGERY     gum graft x 4   REFRACTIVE SURGERY     Family History  Problem Relation Age of Onset   Diabetes Mother        borderline   Hypertension Mother    Thyroid disease Mother    Dementia Mother    Pancreatic cancer Father 31       d. 60   Colon polyps Sister 50       precancer; unknown #; pat half sister   Heart failure Brother    Hypertension Brother    Heart failure Brother    Asthma Brother    Lung cancer Maternal Aunt 45   Kidney cancer Maternal Uncle 83   Colon cancer Maternal Uncle 65   Prostate cancer Maternal Uncle 60   Colon cancer Maternal Uncle 69   Breast cancer Paternal Aunt 65       lumpectomy   Melanoma Paternal Aunt 36       arm   Breast cancer Cousin        mat female cousin; dx 41s   Bone cancer Cousin 40       mat female cousin  Skin cancer Cousin 64       mat female cousin; unknown type   Cancer Other        PGM's sister; unk type; d. 56s   Liver cancer Neg Hx    Esophageal cancer Neg Hx    Social History   Tobacco Use   Smoking status: Former    Current packs/day: 0.00    Types: Cigarettes    Start date: 06/01/1989    Quit date: 06/01/2009    Years since quitting: 13.8   Smokeless tobacco: Never   Tobacco comments:    socially  Vaping Use   Vaping status: Never Used  Substance Use Topics   Alcohol use: Yes    Comment: 2 glasses of wine/month   Drug use: No   Current Outpatient Medications  Medication Sig Dispense Refill   Cholecalciferol (VITAMIN D3) 2000 UNITS capsule Take 2,000 Units by mouth daily.     fexofenadine (ALLEGRA) 180 MG tablet Take 180 mg by mouth daily.     levothyroxine (SYNTHROID, LEVOTHROID) 50 MCG tablet Take 50 mcg by mouth daily.     lisinopril (ZESTRIL) 20 MG tablet Take 20 mg by mouth daily.     nystatin (MYCOSTATIN/NYSTOP) powder Apply 1 Application topically 3 (three) times daily.  Apply to affected area for up to 7 days 15 g 2   ondansetron (ZOFRAN-ODT) 4 MG disintegrating tablet Dissolve 1 tablet (4 mg total) by mouth every 6 (six) hours. 30 tablet 1   rosuvastatin (CRESTOR) 20 MG tablet Take 20 mg by mouth at bedtime.     No current facility-administered medications for this visit.   Allergies  Allergen Reactions   Covid-19 (Mrna) Vaccine Other (See Comments)    Vasculitis Flare up   Venlafaxine Other (See Comments)    Hot feeling Hot feeling Hot feeling Hot feeling   Erythromycin Hives and Rash     Review of Systems: All systems reviewed and negative except where noted in HPI.     Physical Exam:    Wt Readings from Last 3 Encounters:  04/01/23 203 lb (92.1 kg)  02/22/23 202 lb 14.4 oz (92 kg)  01/18/23 195 lb (88.5 kg)    BP 120/76   Pulse 73   Ht 5\' 6"  (1.676 m)   Wt 203 lb (92.1 kg)   LMP 06/01/2002 (LMP Unknown)   BMI 32.77 kg/m  Constitutional:  Pleasant, in no acute distress. Psychiatric: Normal mood and affect. Behavior is normal. Neurological: Alert and oriented to person place and time. Skin: Skin is warm and dry. No rashes noted.   ASSESSMENT AND PLAN;   1) History of colon polyps Patients with ATM gene mutation carrier and increased risk for colon cancer and should undergo colonoscopy every 5 years, or potentially sooner depending on presence of polyps or additional family history. - Last colonoscopy was 07/2019 and normal.  Prior to that, several subcentimeter polyps (path unknown) in 01/2015 - Repeat colonoscopy in 07/2024 for ongoing screening/surveillance  2) ATM gene mutation 3) Family history of Pancreatic Cancer From most recent International CAPS Consortium, ASGE, and AGA guidelines, screening for pancreatic cancer recommended for select high risk individuals.  This patient meets the high risk screening cohort based on the following: -Carriers of a germline BRCA2, BRCA1, p16, PALB2, ATM, MLH1, MSH2, or MSH6 (ie, HNPCC)  gene mutation with at least one affected first-degree relative  Recommended age to start surveillance varies by gene mutation status and family history, as follows: -BRCA2, ATM, PALB 2,  BRCA1, MLH1/MSH2-start at age 69 or 55, or 10 years younger than the youngest affected blood relative  Screening techniques include the following: -Baseline: MRI/MRCP plus EUS plus fasting blood glucose and/or hemoglobin A1c  -Follow-up/surveillance phase: Alternate MRI/MRCP and EUS plus routine fasting blood glucose and/or hemoglobin A1c -If concerning features on imaging, check CA 19-9 -EUS with FNA for solid lesions >=5 mm, cystic lesions with worrisome features, or asymptomatic main pancreatic duct (MPD) strictures (with or without mass) -CT only for solid lesions, regardless of size, or asymptomatic MPD strictures of unknown etiology (without mass)  -Screening interval every 12 months in patients with no abnormalities/nonconcerning abnormalities -Screening interval every 3-6 months in patients with abnormalities that are NOT suspicious for malignancy, but are concerning -EUS evaluation should be performed within 3-6 months for indeterminate lesions (abnormalities that are NOT suspicious for malignancy, but are concerning) -EUS evaluation should be performed within 3 months for high-risk lesions, if surgical resection is not planned. -Immediate surgical referral for abnormality suspicious for malignancy  -New-onset diabetes in a high-risk individual should lead to additional diagnostic studies or change in surveillance interval  -Genetic testing and counseling should be considered for familial pancreas cancer relatives who are eligible for surveillance. A positive germline mutation is associated with an increased risk of neoplastic progression and may also lead to screening for other relevant associated cancers -Participation in a registry or referral to a pancreas Center of Excellence should be pursued when  possible for high-risk patients undergoing pancreas cancer screening -The target detectable pancreatic neoplasms are resectable stage I pancreatic ductal adenocarcinoma and high-risk precursor neoplasms, such as intraductal papillary mucinous neoplasms with high-grade dysplasia and some enlarged pancreatic intraepithelial neoplasias  -We discussed the limitations and potential risks of pancreas cancer screening prior to initiating any screening program, and the patient  wishes to proceed with screening. Plan for the following:  - MRI/MRCP now  - Hgb A1c in 6 months - EUS in 6 months - RTC every 6 months - If index MRI/MRCP and EUS are unrevealing/normal, per protocol, will liberalize to annual screening, alternating between MRI/MRCP and EUS - Valium 2 mg for claustrophobia   4) Claustrophobia - As above, will prescribe Valium for claustrophobia related to MRI  I spent 60 minutes of time, including in depth chart review, independent review of results as outlined above, communicating results with the patient directly, face-to-face time with the patient, coordinating care, ordering studies and medications as appropriate, and documentation.     Ran Tullis V Cristiano Capri, DO, FACG  04/01/2023, 9:24 AM   No ref. provider found

## 2023-04-01 NOTE — Patient Instructions (Signed)
_______________________________________________________  If your blood pressure at your visit was 140/90 or greater, please contact your primary care physician to follow up on this.  If you are age 59 or younger, your body mass index should be between 19-25. Your Body mass index is 32.77 kg/m. If this is out of the aformentioned range listed, please consider follow up with your Primary Care Provider.  ________________________________________________________  The [place] [facility] providers would like to encourage you to use [facility] to communicate with providers for non-urgent requests or questions.  Due to long hold times on the telephone, sending your provider a message by [facility] may be a faster and more efficient way to get a response.  Please allow 48 business hours for a response.  Please remember that this is for non-urgent requests.  _______________________________________________________  Bonita Quin have been scheduled for an MRI/MRCP at [facility] (1st floor-radiology department) on 04-09-23. Your appointment time is 11am. Please arrive to admitting (at main entrance of the hospital) 15 minutes prior to your appointment time for registration purposes. Please make certain not to have anything to eat or drink 4 hours prior to your test. In addition, if you have any metal in your body, have a pacemaker or defibrillator, please be sure to let your ordering physician know. This test typically takes 45 minutes to 1 hour to complete.   You will need a driver on the day of the test as you will using oral sedation (Valium).  Should you need to reschedule, please call [phone] to do so.  Due to recent changes in healthcare laws, you may see the results of your imaging and laboratory studies on MyChart before your provider has had a chance to review them.  We understand that in some cases there may be results that are confusing or concerning to you. Not all laboratory results come back in the same time  frame and the provider may be waiting for multiple results in order to interpret others.  Please give [place] 48 hours in order for your provider to thoroughly review all the results before contacting the office for clarification of your results.   It was a pleasure to see you today!  Vito Cirigliano, D.O.

## 2023-04-06 ENCOUNTER — Ambulatory Visit (HOSPITAL_COMMUNITY)
Admission: RE | Admit: 2023-04-06 | Discharge: 2023-04-06 | Disposition: A | Discharge status: Home / Self Care | Payer: 59 | Source: Ambulatory Visit | Attending: Gastroenterology | Admitting: Gastroenterology

## 2023-04-06 DIAGNOSIS — Z1501 Genetic susceptibility to malignant neoplasm of breast: Secondary | ICD-10-CM | POA: Insufficient documentation

## 2023-04-06 DIAGNOSIS — F40240 Claustrophobia: Secondary | ICD-10-CM | POA: Diagnosis present

## 2023-04-06 DIAGNOSIS — Z1509 Genetic susceptibility to other malignant neoplasm: Secondary | ICD-10-CM | POA: Insufficient documentation

## 2023-04-06 DIAGNOSIS — Z800 Family history of malignant neoplasm of digestive organs: Secondary | ICD-10-CM | POA: Insufficient documentation

## 2023-04-06 MED ORDER — GADOBUTROL 1 MMOL/ML IV SOLN
10.0000 mL | Freq: Once | INTRAVENOUS | Status: AC | PRN
  Administered 2023-04-06: 10 mL via INTRAVENOUS

## 2023-04-07 ENCOUNTER — Other Ambulatory Visit: Payer: Self-pay | Admitting: Gastroenterology

## 2023-04-07 ENCOUNTER — Ambulatory Visit: Payer: 59

## 2023-04-07 DIAGNOSIS — Z800 Family history of malignant neoplasm of digestive organs: Secondary | ICD-10-CM

## 2023-04-07 DIAGNOSIS — F40240 Claustrophobia: Secondary | ICD-10-CM

## 2023-04-07 DIAGNOSIS — Z1509 Genetic susceptibility to other malignant neoplasm: Secondary | ICD-10-CM

## 2023-04-09 ENCOUNTER — Other Ambulatory Visit (HOSPITAL_COMMUNITY): Payer: 59

## 2023-04-12 ENCOUNTER — Other Ambulatory Visit: Payer: Self-pay

## 2023-04-12 DIAGNOSIS — K760 Fatty (change of) liver, not elsewhere classified: Secondary | ICD-10-CM

## 2023-04-12 NOTE — Progress Notes (Signed)
lft

## 2023-04-13 ENCOUNTER — Other Ambulatory Visit (INDEPENDENT_AMBULATORY_CARE_PROVIDER_SITE_OTHER): Payer: 59

## 2023-04-13 DIAGNOSIS — K760 Fatty (change of) liver, not elsewhere classified: Secondary | ICD-10-CM | POA: Diagnosis not present

## 2023-04-13 LAB — HEPATIC FUNCTION PANEL
ALT: 22 U/L (ref 0–35)
AST: 18 U/L (ref 0–37)
Albumin: 3.9 g/dL (ref 3.5–5.2)
Alkaline Phosphatase: 61 U/L (ref 39–117)
Bilirubin, Direct: 0 mg/dL (ref 0.0–0.3)
Total Bilirubin: 0.3 mg/dL (ref 0.2–1.2)
Total Protein: 7.1 g/dL (ref 6.0–8.3)

## 2023-04-14 NOTE — Progress Notes (Addendum)
57 y.o. G53P3003 Married Caucasian female here for annual exam.    Does not leak with laugh, cough, or sneeze on a normal basis.  Drinking a lot of water due to her dx of renal disease.   Seeing oncology due to ATM gene mutation.  Alternating MRI and mammogram every 6 months.   Noting rash under her breasts.  Nystatin powder does not help well.  Secret works to help.  Stress at work.  PCP: Gildardo Cranker, PA-C   Patient's last menstrual period was 06/01/2002 (lmp unknown).           Sexually active: Yes.    The current method of family planning is status post hysterectomy.    Menopausal hormone therapy:  n/a Exercising: No.   Smoker:  former  OB History  Gravida Para Term Preterm AB Living  3 3 3     3   SAB IAB Ectopic Multiple Live Births               # Outcome Date GA Lbr Len/2nd Weight Sex Type Anes PTL Lv  3 Term           2 Term           1 Term              HEALTH MAINTENANCE: No results found for: "DIAGPAP", "HPVHIGH", "ADEQPAP"  History of abnormal Pap or positive HPV:  no Mammogram: Breast MRI: 01/2023, 04/01/2022 BI-RADS CATEGORY  1: Negative.  Colonoscopy:  08/04/2019 Bone Density:  n/a  Result  n/a   Immunization History  Administered Date(s) Administered   Influenza Split 03/20/2010   Influenza,trivalent, recombinat, inj, PF 06/24/2011   Moderna Sars-Covid-2 Vaccination 08/18/2019   Tdap 02/09/2008, 12/15/2018      reports that she quit smoking about 13 years ago. Her smoking use included cigarettes. She started smoking about 33 years ago. She has never used smokeless tobacco. She reports current alcohol use. She reports that she does not use drugs.  Past Medical History:  Diagnosis Date   Allergy    Anxiety    ATM gene mutation positive    Breast mass    Childhood asthma    no problems as an adult   Chronic kidney disease    Depression    Dyspareunia    Hx of adenomatous colonic polyps    Hypercholesteremia    Thyroid disease     Vasculitis (HCC)     Past Surgical History:  Procedure Laterality Date   ABDOMINAL HYSTERECTOMY  2004   TVH--still has ovaries   BLADDER SUSPENSION     breast biopsy Left 12/2014   fibroma   BREAST BIOPSY Left 2016   benign   BREAST SURGERY  12/2014   Benign Lt.breast bx   CARPAL TUNNEL RELEASE Bilateral 2017   CHOLECYSTECTOMY  2015   COLONOSCOPY  02/11/2015   [facility] Never - hx polyps   FOOT FRACTURE SURGERY Right 11/2013   MOUTH SURGERY     gum graft x 4   REFRACTIVE SURGERY      Current Outpatient Medications  Medication Sig Dispense Refill   Cholecalciferol (VITAMIN D3) 2000 UNITS capsule Take 2,000 Units by mouth daily.     fexofenadine (ALLEGRA) 180 MG tablet Take 180 mg by mouth daily.     levothyroxine (SYNTHROID, LEVOTHROID) 50 MCG tablet Take 50 mcg by mouth daily.     lisinopril (ZESTRIL) 10 MG tablet Take 10 mg by mouth daily.  nystatin (MYCOSTATIN/NYSTOP) powder Apply 1 Application topically 3 (three) times daily. Apply to affected area for up to 7 days 15 g 2   rosuvastatin (CRESTOR) 20 MG tablet Take 20 mg by mouth at bedtime.     No current facility-administered medications for this visit.    ALLERGIES: Covid-19 (mrna) vaccine, Venlafaxine, and Erythromycin  Family History  Problem Relation Age of Onset   Diabetes Mother        borderline   Hypertension Mother    Thyroid disease Mother    Dementia Mother    Pancreatic cancer Father 106       d. 69   Colon polyps Sister 63       precancer; unknown #; pat half sister   Heart failure Brother    Hypertension Brother    Heart failure Brother    Asthma Brother    Lung cancer Maternal Aunt 45   Kidney cancer Maternal Uncle 17   Colon cancer Maternal Uncle 65   Prostate cancer Maternal Uncle 60   Colon cancer Maternal Uncle 69   Breast cancer Paternal Aunt 68       lumpectomy   Melanoma Paternal Aunt 72       arm   Breast cancer Cousin        mat female cousin; dx 4s   Bone cancer Cousin 33        mat female cousin   Skin cancer Cousin 42       mat female cousin; unknown type   Cancer Other        PGM's sister; unk type; d. 36s   Liver cancer Neg Hx    Esophageal cancer Neg Hx     Review of Systems  All other systems reviewed and are negative.   PHYSICAL EXAM:  BP 124/84 (BP Location: Left Arm, Patient Position: Sitting, Cuff Size: Large)   Ht 5' 6.25" (1.683 m)   Wt 210 lb (95.3 kg)   LMP 06/01/2002 (LMP Unknown)   BMI 33.64 kg/m     General appearance: alert, cooperative and appears stated age Head: normocephalic, without obvious abnormality, atraumatic Neck: no adenopathy, supple, symmetrical, trachea midline and thyroid normal to inspection and palpation Lungs: clear to auscultation bilaterally Breasts: normal appearance, no masses or tenderness, No nipple retraction or dimpling, No nipple discharge or bleeding, No axillary adenopathy Heart: regular rate and rhythm Abdomen: soft, non-tender; no masses, no organomegaly Extremities: extremities normal, atraumatic, no cyanosis or edema Skin: skin color, texture, turgor normal. Red rash under right breast and under small pannus. Lymph nodes: cervical, supraclavicular, and axillary nodes normal. Neurologic: grossly normal  Pelvic: External genitalia:  no lesions              No abnormal inguinal nodes palpated.              Urethra:  normal appearing urethra with no masses, tenderness or lesions              Bartholins and Skenes: normal                 Vagina: normal appearing vagina with normal color and discharge, no lesions              Cervix:  absent              Pap taken: No. Bimanual Exam:  Uterus:  absent              Adnexa: no mass, fullness, tenderness  Rectal exam: Yes.  .  Confirms.              Anus:  normal sphincter tone, no lesions  Chaperone was present for exam:  Warren Lacy, CMA  PHQ-9 score:  1  ASSESSMENT: Well woman visit with gynecologic exam Status post TVH and probably  midurethral sling. Still has ovaries. Stress incontinence.  Mild.  ATM gene mutation.  Chronic renal disease.  Hx left breast fibroma.   Vasculitis of LEs.   Candida of flexural skin fold.  PLAN: Mammogram and breast MRI screening discussed. Self breast awareness reviewed. Pap and HRV collected:  No. Guidelines for Calcium, Vitamin D, regular exercise program including cardiovascular and weight bearing exercise. Medication refills:  NA Rx for Lotrisone cream.  Follow up:  1 year and prn.

## 2023-04-20 ENCOUNTER — Other Ambulatory Visit (HOSPITAL_COMMUNITY): Payer: Self-pay

## 2023-04-28 ENCOUNTER — Ambulatory Visit (INDEPENDENT_AMBULATORY_CARE_PROVIDER_SITE_OTHER): Payer: 59 | Admitting: Obstetrics and Gynecology

## 2023-04-28 ENCOUNTER — Encounter: Payer: Self-pay | Admitting: Obstetrics and Gynecology

## 2023-04-28 VITALS — BP 124/84 | Ht 66.25 in | Wt 210.0 lb

## 2023-04-28 DIAGNOSIS — Z01419 Encounter for gynecological examination (general) (routine) without abnormal findings: Secondary | ICD-10-CM

## 2023-04-28 DIAGNOSIS — B372 Candidiasis of skin and nail: Secondary | ICD-10-CM | POA: Diagnosis not present

## 2023-04-28 MED ORDER — CLOTRIMAZOLE-BETAMETHASONE 1-0.05 % EX CREA: 1.0000 | TOPICAL_CREAM | Freq: Two times a day (BID) | CUTANEOUS | 0 refills | Status: AC

## 2023-04-28 NOTE — Patient Instructions (Signed)

## 2023-07-21 ENCOUNTER — Encounter: Payer: Self-pay | Admitting: Genetic Counselor

## 2023-08-13 ENCOUNTER — Ambulatory Visit: Payer: 59 | Admitting: Internal Medicine

## 2023-08-13 ENCOUNTER — Encounter: Payer: Self-pay | Admitting: Internal Medicine

## 2023-08-13 ENCOUNTER — Other Ambulatory Visit (HOSPITAL_COMMUNITY): Payer: Self-pay

## 2023-08-13 VITALS — BP 120/98 | HR 67 | Temp 98.1°F | Ht 66.25 in | Wt 224.0 lb

## 2023-08-13 DIAGNOSIS — N02B9 Other recurrent and persistent immunoglobulin A nephropathy: Secondary | ICD-10-CM

## 2023-08-13 DIAGNOSIS — Z1509 Genetic susceptibility to other malignant neoplasm: Secondary | ICD-10-CM

## 2023-08-13 DIAGNOSIS — E785 Hyperlipidemia, unspecified: Secondary | ICD-10-CM | POA: Insufficient documentation

## 2023-08-13 DIAGNOSIS — E782 Mixed hyperlipidemia: Secondary | ICD-10-CM

## 2023-08-13 DIAGNOSIS — Z1501 Genetic susceptibility to malignant neoplasm of breast: Secondary | ICD-10-CM | POA: Diagnosis not present

## 2023-08-13 DIAGNOSIS — R7303 Prediabetes: Secondary | ICD-10-CM

## 2023-08-13 MED ORDER — SEMAGLUTIDE-WEIGHT MANAGEMENT 0.5 MG/0.5ML ~~LOC~~ SOAJ
0.5000 mg | SUBCUTANEOUS | 0 refills | Status: AC
  Filled 2023-08-13 – 2023-09-09 (×2): qty 2, 28d supply, fill #0

## 2023-08-13 MED ORDER — SEMAGLUTIDE-WEIGHT MANAGEMENT 0.25 MG/0.5ML ~~LOC~~ SOAJ
0.2500 mg | SUBCUTANEOUS | 0 refills | Status: AC
  Filled 2023-08-13: qty 2, 28d supply, fill #0

## 2023-08-13 MED ORDER — SEMAGLUTIDE-WEIGHT MANAGEMENT 1.7 MG/0.75ML ~~LOC~~ SOAJ
1.7000 mg | SUBCUTANEOUS | 0 refills | Status: AC
  Filled 2023-08-13: qty 3, 28d supply, fill #0

## 2023-08-13 MED ORDER — SEMAGLUTIDE-WEIGHT MANAGEMENT 2.4 MG/0.75ML ~~LOC~~ SOAJ
2.4000 mg | SUBCUTANEOUS | 0 refills | Status: DC
Start: 2023-12-07 — End: 2023-12-28
  Filled 2023-08-13: qty 3, 28d supply, fill #0

## 2023-08-13 MED ORDER — SEMAGLUTIDE-WEIGHT MANAGEMENT 1 MG/0.5ML ~~LOC~~ SOAJ
1.0000 mg | SUBCUTANEOUS | 0 refills | Status: AC
  Filled 2023-08-13 – 2023-10-17 (×2): qty 2, 28d supply, fill #0

## 2023-08-13 NOTE — Assessment & Plan Note (Signed)
 Seeing GI and participating in appropriate screenings.

## 2023-08-13 NOTE — Assessment & Plan Note (Signed)
 She is taking lisinopril 10 mg daily to help with progression. She is seeing nephrology and gets labs with them. Getting records to review.

## 2023-08-13 NOTE — Patient Instructions (Addendum)
 We do not need labs today.   We have sent in the wegovy to the [place] pharmacy so we can get this for you.

## 2023-08-13 NOTE — Assessment & Plan Note (Signed)
 Taking crestor 20 mg daily and getting records to assess control. Recheck and adjust as needed.

## 2023-08-13 NOTE — Assessment & Plan Note (Signed)
 BMI 35.8 and complicated by hyperlipidemia and osteoarthritis. Rx wegovy to help and she does have serious medical conditions that will worsen without weight loss. Follow up 2-3 months counseled about benefit and risk today.

## 2023-08-13 NOTE — Assessment & Plan Note (Signed)
 Last HgA1c 5.7 that I can view and getting records from her prior primary to assess this.

## 2023-08-13 NOTE — Progress Notes (Signed)
   Subjective:   Patient ID: Holly Mcdaniel, female    DOB: 06-24-1965, 58 y.o.   MRN: [id number]  HPI The patient is a new 58 YO female coming in for discussion about chronic care, weight loss, and chronic joint pain.   PMH, FMh, social history reviewed and updated  Review of Systems  Constitutional: Negative.   HENT: Negative.    Eyes: Negative.   Respiratory:  Negative for cough, chest tightness and shortness of breath.   Cardiovascular:  Negative for chest pain, palpitations and leg swelling.  Gastrointestinal:  Negative for abdominal distention, abdominal pain, constipation, diarrhea, nausea and vomiting.  Musculoskeletal:  Positive for arthralgias.  Skin: Negative.   Neurological: Negative.   Psychiatric/Behavioral: Negative.      Objective:  Physical Exam Constitutional:      Appearance: She is well-developed.  HENT:     Head: Normocephalic and atraumatic.  Cardiovascular:     Rate and Rhythm: Normal rate and regular rhythm.  Pulmonary:     Effort: Pulmonary effort is normal. No respiratory distress.     Breath sounds: Normal breath sounds. No wheezing or rales.  Abdominal:     General: Bowel sounds are normal. There is no distension.     Palpations: Abdomen is soft.     Tenderness: There is no abdominal tenderness. There is no rebound.  Musculoskeletal:     Cervical back: Normal range of motion.  Skin:    General: Skin is warm and dry.  Neurological:     Mental Status: She is alert and oriented to person, place, and time.     Coordination: Coordination normal.     Vitals:   08/13/23 0813  BP: (!) 120/98  Pulse: 67  Temp: 98.1 F (36.7 C)  TempSrc: Oral  SpO2: 98%  Weight: 224 lb (101.6 kg)  Height: 5' 6.25" (1.683 m)    Assessment & Plan:

## 2023-08-16 ENCOUNTER — Ambulatory Visit
Admission: RE | Admit: 2023-08-16 | Discharge: 2023-08-16 | Disposition: A | Discharge status: Home / Self Care | Payer: 59 | Source: Ambulatory Visit | Attending: Hematology and Oncology | Admitting: Hematology and Oncology

## 2023-08-16 DIAGNOSIS — Z1509 Genetic susceptibility to other malignant neoplasm: Secondary | ICD-10-CM

## 2023-08-23 ENCOUNTER — Other Ambulatory Visit (HOSPITAL_COMMUNITY): Payer: Self-pay

## 2023-08-23 MED ORDER — PENICILLIN V POTASSIUM 500 MG PO TABS
500.0000 mg | ORAL_TABLET | Freq: Four times a day (QID) | ORAL | 0 refills | Status: DC
  Filled 2023-08-23: qty 28, 7d supply, fill #0

## 2023-08-23 MED ORDER — HYDROCODONE-ACETAMINOPHEN 5-325 MG PO TABS
1.0000 | ORAL_TABLET | ORAL | 0 refills | Status: DC
  Filled 2023-08-23: qty 14, 3d supply, fill #0

## 2023-09-07 ENCOUNTER — Other Ambulatory Visit: Payer: Self-pay | Admitting: Internal Medicine

## 2023-09-09 ENCOUNTER — Encounter (HOSPITAL_COMMUNITY): Payer: Self-pay

## 2023-09-09 ENCOUNTER — Other Ambulatory Visit (HOSPITAL_COMMUNITY): Payer: Self-pay

## 2023-09-10 ENCOUNTER — Other Ambulatory Visit (INDEPENDENT_AMBULATORY_CARE_PROVIDER_SITE_OTHER)

## 2023-09-10 ENCOUNTER — Ambulatory Visit: Payer: 59 | Admitting: Gastroenterology

## 2023-09-10 ENCOUNTER — Encounter: Payer: Self-pay | Admitting: Gastroenterology

## 2023-09-10 VITALS — BP 122/88 | HR 84 | Ht 66.0 in | Wt 218.0 lb

## 2023-09-10 DIAGNOSIS — Z800 Family history of malignant neoplasm of digestive organs: Secondary | ICD-10-CM

## 2023-09-10 DIAGNOSIS — Z79899 Other long term (current) drug therapy: Secondary | ICD-10-CM | POA: Diagnosis not present

## 2023-09-10 DIAGNOSIS — Z01818 Encounter for other preprocedural examination: Secondary | ICD-10-CM | POA: Diagnosis not present

## 2023-09-10 DIAGNOSIS — Z860100 Personal history of colon polyps, unspecified: Secondary | ICD-10-CM

## 2023-09-10 DIAGNOSIS — Z1509 Genetic susceptibility to other malignant neoplasm: Secondary | ICD-10-CM

## 2023-09-10 LAB — HEMOGLOBIN A1C: Hgb A1c MFr Bld: 5.9 % (ref 4.6–6.5)

## 2023-09-10 NOTE — Progress Notes (Signed)
 Chief Complaint:    ATM gene mutation, Pancreatic Cancer screening   GI History: Holly Mcdaniel is a 58 y.o. 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  Additionally, modified colon cancer screening to every 5 years due to elevated risk with ATM gene mutation.   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     - 12/2022: Hgb A1c 5.7% in 12/2022 - 04/2023: MRI/MRCP with normal-appearing pancreas.  Mild diffuse hepatic steatosis.  Normal liver enzymes    Endoscopic History: - 02/11/2015: Colonoscopy: 6 mm ascending colon polyp, 2 mm transverse colon polyp, 2 mm ascending colon polyp, 3-4 mm descending colon polyp, 2 small 2 mm descending colon polyps, small internal hemorrhoids.  No path available for review - 08/04/2019: Colonoscopy: Internal hemorrhoids, otherwise normal.    HPI:     Patient is a 58 y.o. female presenting to the Gastroenterology Clinic for follow-up.  She was initially seen by me on 04/01/2019 for to initiate Pancreatic Cancer screening protocol due to ATM gene mutation and family history of pancreatic cancer as outlined above.  Additionally, had modified colon cancer screening to every 5 years due to gene mutation and family history.  Otherwise no GI symptoms or active issues today.  Review of systems:     No chest pain, no SOB, no fevers, no urinary sx   Past Medical History:  Diagnosis Date   Allergy    Anxiety    Arthritis 2021   ATM gene mutation positive    Breast mass    Childhood asthma    no problems as an adult   Chronic kidney disease    Depression     Dyspareunia    Hx of adenomatous colonic polyps    Hypercholesteremia    Thyroid disease    Vasculitis (HCC)     Patient's surgical history, family medical history, social history, medications and allergies were all reviewed in Epic    Current Outpatient Medications  Medication Sig Dispense Refill   Cholecalciferol (VITAMIN D3) 2000 UNITS capsule Take 2,000 Units by mouth daily.     clotrimazole-betamethasone (LOTRISONE) cream Apply 1 Application topically 2 (two) times daily. Use for 2 weeks at a time as needed. 30 g 0   fexofenadine (ALLEGRA) 180 MG tablet Take 180 mg by mouth daily.     levothyroxine (SYNTHROID, LEVOTHROID) 50 MCG tablet Take 50 mcg by mouth daily.     lisinopril (ZESTRIL) 10 MG tablet Take 10 mg by mouth daily.     nystatin (MYCOSTATIN/NYSTOP) powder Apply 1 Application topically 3 (three) times daily. Apply to affected area for up to 7 days 15 g 2   rosuvastatin (CRESTOR) 20 MG tablet Take 20 mg by mouth at bedtime.     Semaglutide-Weight Management 0.25 MG/0.5ML SOAJ Inject 0.25 mg into the skin once a week for 28 days. 2 mL 0   [START ON 09/11/2023] Semaglutide-Weight Management 0.5 MG/0.5ML SOAJ Inject 0.5 mg into the skin once a week for 28 days. 2 mL 0   [START ON 10/10/2023] Semaglutide-Weight Management 1 MG/0.5ML SOAJ Inject 1 mg into the skin once a  week for 28 days. 2 mL 0   [START ON 11/08/2023] Semaglutide-Weight Management 1.7 MG/0.75ML SOAJ Inject 1.7 mg into the skin once a week for 28 days. 3 mL 0   [START ON 12/07/2023] Semaglutide-Weight Management 2.4 MG/0.75ML SOAJ Inject 2.4 mg into the skin once a week for 28 days. 3 mL 0   No current facility-administered medications for this visit.    Physical Exam:     BP 122/88   Pulse 84   Ht 5\' 6"  (1.676 m)   Wt 218 lb (98.9 kg)   LMP 06/01/2002 (LMP Unknown)   BMI 35.19 kg/m   GENERAL:  Pleasant female in NAD PSYCH: : Cooperative, normal affect Musculoskeletal:  Normal muscle tone, normal  strength NEURO: Alert and oriented x 3, no focal neurologic deficits   IMPRESSION and PLAN:    1) ATM gene mutation 2) Family history of Pancreatic Cancer Up-to-date on PC screening protocol.  - Repeat hemoglobin A1c  - Will schedule EUS for ongoing PC screening protocol.  If normal/unremarkable, plan to liberalize to annual screening  3) History of colon polyps Patients with ATM gene mutation carrier and increased risk for colon cancer and should undergo colonoscopy every 5 years, or potentially sooner depending on presence of polyps or additional family history. - Last colonoscopy was 07/2019 and normal.  Prior to that, several subcentimeter polyps (path unknown) in 01/2015 - Repeat colonoscopy in 07/2024 for ongoing screening/surveillance  4) Medication management - Hold Wegovy 1 week prior to EUS  RTC in 6 months          Shellia Cleverly ,DO, FACG 09/10/2023, 1:53 PM

## 2023-09-10 NOTE — Patient Instructions (Addendum)
 Your provider has requested that you go to the basement level for lab work before leaving today. Press "B" on the elevator. The lab is located at the first door on the left as you exit the elevator.   We will call you to schedule the EUS. Please follow written instructions given to you at your visit today.  If you use inhalers (even only as needed), please bring them with you on the day of your procedure.  If you take any of the following medications, they will need to be adjusted prior to your procedure:   DO NOT TAKE 7 DAYS PRIOR TO TEST- Trulicity (dulaglutide) Ozempic, Wegovy (semaglutide) Mounjaro (tirzepatide) Bydureon Bcise (exanatide extended release)  DO NOT TAKE 1 DAY PRIOR TO YOUR TEST Rybelsus (semaglutide) Adlyxin (lixisenatide) Victoza (liraglutide) Byetta (exanatide) ___________________________________________________________________________  _______________________________________________________  If your blood pressure at your visit was 140/90 or greater, please contact your primary care physician to follow up on this.  _______________________________________________________  If you are age 28 or older, your body mass index should be between 23-30. Your Body mass index is 35.19 kg/m. If this is out of the aforementioned range listed, please consider follow up with your Primary Care Provider.  If you are age 20 or younger, your body mass index should be between 19-25. Your Body mass index is 35.19 kg/m. If this is out of the aformentioned range listed, please consider follow up with your Primary Care Provider.   ________________________________________________________  The [facility] providers would like to encourage you to use [facility] to communicate with providers for non-urgent requests or questions.  Due to long hold times on the telephone, sending your provider a message by [facility] may be a faster and more efficient way to get a response.  Please allow 48  business hours for a response.  Please remember that this is for non-urgent requests.  _______________________________________________________  It was a pleasure to see you today!  Vito Cirigliano, D.O.

## 2023-09-22 ENCOUNTER — Telehealth: Payer: Self-pay

## 2023-09-22 NOTE — Telephone Encounter (Signed)
 Phone call to the patient and made her aware that she has been scheduled for EUS with Dr Brice Campi on 11-25-23 at 930am at [facility].  Patient agreed to plan and verbalized understanding.  No further questions.

## 2023-09-22 NOTE — Telephone Encounter (Signed)
 Left message for patient to return call to further discuss scheduling EUS with Dr Brice Campi in June.  Will continue efforts.

## 2023-09-27 ENCOUNTER — Encounter: Payer: Self-pay | Admitting: Internal Medicine

## 2023-10-01 ENCOUNTER — Other Ambulatory Visit: Payer: Self-pay

## 2023-10-01 MED ORDER — LISINOPRIL 10 MG PO TABS: 10.0000 mg | ORAL_TABLET | Freq: Every day | ORAL | 1 refills | Status: DC

## 2023-10-01 MED ORDER — ROSUVASTATIN CALCIUM 20 MG PO TABS: 20.0000 mg | ORAL_TABLET | Freq: Every day | ORAL | 1 refills | Status: DC

## 2023-10-05 ENCOUNTER — Encounter: Payer: Self-pay | Admitting: Gastroenterology

## 2023-10-06 ENCOUNTER — Telehealth: Payer: Self-pay

## 2023-10-06 NOTE — Telephone Encounter (Signed)
 Good morning, I have a conflict with my driver for June 52YH. Can I get a call after 3pm today to discuss rescheduling?   Thank you so much.  Pearlie Bougie [phone]

## 2023-10-06 NOTE — Telephone Encounter (Signed)
 Thank you for your help. Please try to avoid August 7 and August 14

## 2023-10-07 NOTE — Telephone Encounter (Signed)
 Appt moved to 12/22/23 at 930 am at [facility] with GM   EUS scheduled, pt instructed and medications reviewed.  Patient instructions mailed to home.  Patient to call with any questions or concerns.

## 2023-10-11 ENCOUNTER — Other Ambulatory Visit (HOSPITAL_COMMUNITY): Payer: Self-pay

## 2023-10-15 ENCOUNTER — Other Ambulatory Visit: Payer: Self-pay | Admitting: Internal Medicine

## 2023-10-17 ENCOUNTER — Other Ambulatory Visit (HOSPITAL_COMMUNITY): Payer: Self-pay

## 2023-10-18 ENCOUNTER — Other Ambulatory Visit (HOSPITAL_COMMUNITY): Payer: Self-pay

## 2023-11-02 DIAGNOSIS — F4310 Post-traumatic stress disorder, unspecified: Secondary | ICD-10-CM | POA: Diagnosis not present

## 2023-11-06 ENCOUNTER — Other Ambulatory Visit (HOSPITAL_COMMUNITY): Payer: Self-pay

## 2023-11-10 ENCOUNTER — Other Ambulatory Visit: Payer: Self-pay

## 2023-11-11 ENCOUNTER — Other Ambulatory Visit (HOSPITAL_COMMUNITY): Payer: Self-pay

## 2023-11-16 ENCOUNTER — Other Ambulatory Visit: Payer: Self-pay

## 2023-11-16 ENCOUNTER — Encounter: Payer: Self-pay | Admitting: Internal Medicine

## 2023-11-16 MED ORDER — ROSUVASTATIN CALCIUM 20 MG PO TABS: 20.0000 mg | ORAL_TABLET | Freq: Every day | ORAL | 1 refills | Status: DC

## 2023-11-25 DIAGNOSIS — F4310 Post-traumatic stress disorder, unspecified: Secondary | ICD-10-CM | POA: Diagnosis not present

## 2023-12-02 ENCOUNTER — Telehealth: Payer: Self-pay

## 2023-12-02 NOTE — Telephone Encounter (Signed)
 LMTCB in regards to rescheduling 12/07/23 f/u appt with Dr San d/t EUS procedure being moved to 12/22/23.

## 2023-12-07 ENCOUNTER — Ambulatory Visit: Admitting: Gastroenterology

## 2023-12-15 ENCOUNTER — Encounter (HOSPITAL_COMMUNITY): Payer: Self-pay | Admitting: Gastroenterology

## 2023-12-21 NOTE — Anesthesia Preprocedure Evaluation (Signed)
 Anesthesia Evaluation  Patient identified by MRN, date of birth, ID band Patient awake    Reviewed: Allergy & Precautions, NPO status , Patient's Chart, lab work & pertinent test results  History of Anesthesia Complications Negative for: history of anesthetic complications  Airway Mallampati: II  TM Distance: >3 FB Neck ROM: Full    Dental no notable dental hx. (+) Teeth Intact, Dental Advisory Given   Pulmonary asthma , former smoker   Pulmonary exam normal breath sounds clear to auscultation       Cardiovascular negative cardio ROS Normal cardiovascular exam Rhythm:Regular Rate:Normal     Neuro/Psych  PSYCHIATRIC DISORDERS Anxiety Depression       GI/Hepatic Pancreatic ca   Endo/Other  GLP 1  Renal/GU Renal disease     Musculoskeletal  (+) Arthritis ,    Abdominal   Peds  Hematology   Anesthesia Other Findings All: erythromycin venflaxine  Reproductive/Obstetrics                              Anesthesia Physical Anesthesia Plan  ASA: 3  Anesthesia Plan: MAC   Post-op Pain Management: Minimal or no pain anticipated   Induction: Intravenous  PONV Risk Score and Plan: Treatment may vary due to age or medical condition and Propofol  infusion  Airway Management Planned: Natural Airway and Nasal Cannula  Additional Equipment: None  Intra-op Plan:   Post-operative Plan:   Informed Consent: I have reviewed the patients History and Physical, chart, labs and discussed the procedure including the risks, benefits and alternatives for the proposed anesthesia with the patient or authorized representative who has indicated his/her understanding and acceptance.     Dental advisory given  Plan Discussed with: CRNA and Surgeon  Anesthesia Plan Comments:          Anesthesia Quick Evaluation

## 2023-12-22 ENCOUNTER — Encounter (HOSPITAL_COMMUNITY)
Admission: RE | Disposition: A | Discharge status: Home / Self Care | Payer: Self-pay | Source: Home / Self Care | Attending: Gastroenterology

## 2023-12-22 ENCOUNTER — Encounter (HOSPITAL_COMMUNITY): Payer: Self-pay | Admitting: Gastroenterology

## 2023-12-22 ENCOUNTER — Telehealth: Payer: Self-pay

## 2023-12-22 ENCOUNTER — Ambulatory Visit (HOSPITAL_COMMUNITY): Payer: Self-pay | Admitting: Anesthesiology

## 2023-12-22 ENCOUNTER — Ambulatory Visit (HOSPITAL_COMMUNITY)
Admission: RE | Admit: 2023-12-22 | Discharge: 2023-12-22 | Disposition: A | Discharge status: Home / Self Care | Attending: Gastroenterology | Admitting: Gastroenterology

## 2023-12-22 ENCOUNTER — Other Ambulatory Visit: Payer: Self-pay

## 2023-12-22 DIAGNOSIS — K209 Esophagitis, unspecified without bleeding: Secondary | ICD-10-CM | POA: Diagnosis not present

## 2023-12-22 DIAGNOSIS — Z800 Family history of malignant neoplasm of digestive organs: Secondary | ICD-10-CM

## 2023-12-22 DIAGNOSIS — Z1509 Genetic susceptibility to other malignant neoplasm: Secondary | ICD-10-CM | POA: Diagnosis not present

## 2023-12-22 DIAGNOSIS — K8050 Calculus of bile duct without cholangitis or cholecystitis without obstruction: Secondary | ICD-10-CM | POA: Diagnosis not present

## 2023-12-22 DIAGNOSIS — I899 Noninfective disorder of lymphatic vessels and lymph nodes, unspecified: Secondary | ICD-10-CM

## 2023-12-22 DIAGNOSIS — K449 Diaphragmatic hernia without obstruction or gangrene: Secondary | ICD-10-CM | POA: Diagnosis not present

## 2023-12-22 DIAGNOSIS — K3189 Other diseases of stomach and duodenum: Secondary | ICD-10-CM

## 2023-12-22 DIAGNOSIS — K2950 Unspecified chronic gastritis without bleeding: Secondary | ICD-10-CM | POA: Diagnosis not present

## 2023-12-22 DIAGNOSIS — Z1501 Genetic susceptibility to malignant neoplasm of breast: Secondary | ICD-10-CM

## 2023-12-22 DIAGNOSIS — K869 Disease of pancreas, unspecified: Secondary | ICD-10-CM | POA: Diagnosis not present

## 2023-12-22 DIAGNOSIS — K838 Other specified diseases of biliary tract: Secondary | ICD-10-CM | POA: Insufficient documentation

## 2023-12-22 DIAGNOSIS — F418 Other specified anxiety disorders: Secondary | ICD-10-CM | POA: Diagnosis not present

## 2023-12-22 DIAGNOSIS — K2289 Other specified disease of esophagus: Secondary | ICD-10-CM

## 2023-12-22 DIAGNOSIS — Z1289 Encounter for screening for malignant neoplasm of other sites: Secondary | ICD-10-CM

## 2023-12-22 DIAGNOSIS — K828 Other specified diseases of gallbladder: Secondary | ICD-10-CM | POA: Diagnosis not present

## 2023-12-22 DIAGNOSIS — Z87891 Personal history of nicotine dependence: Secondary | ICD-10-CM | POA: Insufficient documentation

## 2023-12-22 DIAGNOSIS — K2970 Gastritis, unspecified, without bleeding: Secondary | ICD-10-CM

## 2023-12-22 HISTORY — PX: ESOPHAGOGASTRODUODENOSCOPY: SHX5428

## 2023-12-22 HISTORY — PX: EUS: SHX5427

## 2023-12-22 LAB — HEPATIC FUNCTION PANEL
ALT: 19 U/L (ref 0–44)
AST: 15 U/L (ref 15–41)
Albumin: 3.7 g/dL (ref 3.5–5.0)
Alkaline Phosphatase: 55 U/L (ref 38–126)
Bilirubin, Direct: <0.1 mg/dL (ref 0.0–0.2)
Total Bilirubin: 0.9 mg/dL (ref 0.0–1.2)
Total Protein: 7.2 g/dL (ref 6.5–8.1)

## 2023-12-22 MED ORDER — MIDAZOLAM HCL 2 MG/2ML IJ SOLN
INTRAMUSCULAR | Status: AC
  Filled 2023-12-22: qty 2

## 2023-12-22 MED ORDER — PROPOFOL 500 MG/50ML IV EMUL
INTRAVENOUS | Status: DC | PRN
  Administered 2023-12-22: 100 ug/kg/min via INTRAVENOUS

## 2023-12-22 MED ORDER — FENTANYL CITRATE (PF) 100 MCG/2ML IJ SOLN
INTRAMUSCULAR | Status: DC | PRN
  Administered 2023-12-22: 25 ug via INTRAVENOUS

## 2023-12-22 MED ORDER — LIDOCAINE 2% (20 MG/ML) 5 ML SYRINGE
INTRAMUSCULAR | Status: DC | PRN
  Administered 2023-12-22: 50 mg via INTRAVENOUS

## 2023-12-22 MED ORDER — MIDAZOLAM HCL 5 MG/5ML IJ SOLN
INTRAMUSCULAR | Status: DC | PRN
  Administered 2023-12-22: 1 mg via INTRAVENOUS
  Administered 2023-12-22 (×2): .5 mg via INTRAVENOUS

## 2023-12-22 MED ORDER — LACTATED RINGERS IV SOLN: INTRAVENOUS | Status: DC | PRN

## 2023-12-22 MED ORDER — FENTANYL CITRATE (PF) 100 MCG/2ML IJ SOLN
INTRAMUSCULAR | Status: AC
  Filled 2023-12-22: qty 2

## 2023-12-22 MED ORDER — PANTOPRAZOLE SODIUM 40 MG PO TBEC: 40.0000 mg | DELAYED_RELEASE_TABLET | Freq: Two times a day (BID) | ORAL | 6 refills | Status: DC

## 2023-12-22 MED ORDER — SODIUM CHLORIDE 0.9 % IV SOLN
INTRAVENOUS | Status: DC | PRN
  Administered 2023-12-22: 500 mL via INTRAMUSCULAR

## 2023-12-22 MED ORDER — PROPOFOL 10 MG/ML IV BOLUS
INTRAVENOUS | Status: AC
  Filled 2023-12-22: qty 20

## 2023-12-22 MED ORDER — SODIUM CHLORIDE 0.9 % IV SOLN: INTRAVENOUS | Status: DC

## 2023-12-22 NOTE — Discharge Instructions (Signed)

## 2023-12-22 NOTE — Telephone Encounter (Signed)
-----   Message from Nurse Patty P sent at 12/22/2023 11:12 AM EDT ----- Patient needs a EGD recall with Dr. San in 4 months.  MRI/MRCP recall under Dr. Rennis name in July 2026 for pancreas cancer screening high risk cohort.  Thanks. GM

## 2023-12-22 NOTE — Telephone Encounter (Signed)
 Sent recall EGD and MRI to Pod B   Will set up ERCP and send clearance

## 2023-12-22 NOTE — H&P (Signed)
 GASTROENTEROLOGY PROCEDURE H&P NOTE   Primary Care Physician: Rollene Almarie LABOR, MD  HPI: Holly Mcdaniel is a 58 y.o. female who presents for EGD/EUS for High Risk Pancreatic Cancer Screening ATM Mutation + FHx Pancreatic Cancer.  Past Medical History:  Diagnosis Date   Allergy    Anxiety    Arthritis 2021   ATM gene mutation positive    Breast mass    Childhood asthma    no problems as an adult   Chronic kidney disease    Depression    Dyspareunia    Hx of adenomatous colonic polyps    Hypercholesteremia    Thyroid disease    Vasculitis (HCC)    Past Surgical History:  Procedure Laterality Date   ABDOMINAL HYSTERECTOMY  2004   TVH--still has ovaries   BLADDER SUSPENSION     breast biopsy Left 12/2014   fibroma   BREAST BIOPSY Left 2016   benign   BREAST SURGERY  12/2014   Benign Lt.breast bx   CARPAL TUNNEL RELEASE Bilateral 2017   CHOLECYSTECTOMY  2015   COLONOSCOPY  02/11/2015   [facility] Dr [facility]ef[facility] - hx polyps   EYE SURGERY  2022   FOOT FRACTURE SURGERY Right 11/2013   MOUTH SURGERY     gum graft x 4   REFRACTIVE SURGERY     No current facility-administered medications for this encounter.   No current facility-administered medications for this encounter. Allergies  Allergen Reactions   Covid-19 (Mrna) Vaccine Other (See Comments)    Vasculitis Flare up   Venlafaxine Other (See Comments)    Hot feeling Hot feeling Hot feeling Hot feeling   Erythromycin Hives and Rash   Family History  Problem Relation Age of Onset   Diabetes Mother        borderline   Hypertension Mother    Thyroid disease Mother    Dementia Mother    Arthritis Mother    Depression Mother    Hearing loss Mother    Heart disease Mother    Kidney disease Mother    Pancreatic cancer Father 67       d. 38   Cancer Father    Colon polyps Sister 63       precancer; unknown #; pat half sister   Heart failure Brother    Hypertension Brother    Heart disease Brother    Kidney  disease Brother    Heart failure Brother    Asthma Brother    Lung cancer Maternal Aunt 45   Kidney cancer Maternal Uncle 35   Colon cancer Maternal Uncle 65   Prostate cancer Maternal Uncle 60   Colon cancer Maternal Uncle 69   Breast cancer Paternal Aunt 3       lumpectomy   Melanoma Paternal Aunt 75       arm   Breast cancer Cousin        mat female cousin; dx 40s   Bone cancer Cousin 35       mat female cousin   Skin cancer Cousin 78       mat female cousin; unknown type   Cancer Other        PGM's sister; unk type; d. 7s   Kidney disease Brother    Liver cancer Neg Hx    Esophageal cancer Neg Hx    Social History   Socioeconomic History   Marital status: Married    Spouse name: Not on file   Number of children:  3   Years of education: Not on file   Highest education level: Associate degree: academic program  Occupational History   Occupation: city clerk  Tobacco Use   Smoking status: Former    Current packs/day: 0.00    Average packs/day: 0.3 packs/day for 10.0 years (2.5 ttl pk-yrs)    Types: Cigarettes    Start date: 06/01/1989    Quit date: 06/01/2009    Years since quitting: 14.5   Smokeless tobacco: Never   Tobacco comments:    socially  Vaping Use   Vaping status: Never Used  Substance and Sexual Activity   Alcohol  use: Yes    Alcohol /week: 1.0 standard drink of alcohol     Types: 1 Glasses of wine per week    Comment: Less than one a week   Drug use: No   Sexual activity: Yes    Partners: Male    Birth control/protection: Post-menopausal, Surgical    Comment: hysterectomy  Other Topics Concern   Not on file  Social History Narrative   Not on file   Social Drivers of Health   Financial Resource Strain: Low Risk  (08/09/2023)   Overall Financial Resource Strain (CARDIA)    Difficulty of Paying Living Expenses: Not hard at all  Food Insecurity: No Food Insecurity (08/09/2023)   Hunger Vital Sign    Worried About Running Out of Food in the Last  Year: Never true    Ran Out of Food in the Last Year: Never true  Transportation Needs: No Transportation Needs (08/09/2023)   PRAPARE - Administrator, Civil Service (Medical): No    Lack of Transportation (Non-Medical): No  Physical Activity: Unknown (08/09/2023)   Exercise Vital Sign    Days of Exercise per Week: 0 days    Minutes of Exercise per Session: Not on file  Stress: Stress Concern Present (08/09/2023)   [employer] of Occupational Health - Occupational Stress Questionnaire    Feeling of Stress : To some extent  Social Connections: Socially Integrated (08/09/2023)   Social Connection and Isolation Panel    Frequency of Communication with Friends and Family: More than three times a week    Frequency of Social Gatherings with Friends and Family: More than three times a week    Attends Religious Services: More than 4 times per year    Active Member of [employer] or Organizations: Yes    Attends Engineer, structural: More than 4 times per year    Marital Status: Married  Catering manager Violence: Not At Risk (01/28/2023)   Received from [facility]   HITS    Over the last 12 months how often did your partner physically hurt you?: Never    Over the last 12 months how often did your partner insult you or talk down to you?: Never    Over the last 12 months how often did your partner threaten you with physical harm?: Never    Over the last 12 months how often did your partner scream or curse at you?: Never    Physical Exam: Today's Vitals   12/15/23 1146  Weight: 98 kg   Body mass index is 34.87 kg/m. GEN: NAD EYE: Sclerae anicteric ENT: MMM CV: Non-tachycardic GI: Soft, NT/ND NEURO:  Alert & Oriented x 3  Lab Results: No results for input(s): WBC, HGB, HCT, PLT in the last 72 hours. BMET No results for input(s): NA, K, CL, CO2, GLUCOSE, BUN, CREATININE, CALCIUM  in the last 72 hours.  LFT No results for input(s): PROT,  ALBUMIN, AST, ALT, ALKPHOS, BILITOT, BILIDIR, IBILI in the last 72 hours. PT/INR No results for input(s): LABPROT, INR in the last 72 hours.   Impression / Plan: This is a 58 y.o.female who presents for EGD/EUS for High Risk Pancreatic Cancer Screening ATM Mutation + FHx Pancreatic Cancer.  The risks of an EUS including intestinal perforation, bleeding, infection, aspiration, and medication effects were discussed as was the possibility it may not give a definitive diagnosis if a biopsy is performed.  When a biopsy of the pancreas is done as part of the EUS, there is an additional risk of pancreatitis at the rate of about 1-2%.  It was explained that procedure related pancreatitis is typically mild, although it can be severe and even life threatening, which is why we do not perform random pancreatic biopsies and only biopsy a lesion/area we feel is concerning enough to warrant the risk.   The risks and benefits of endoscopic evaluation/treatment were discussed with the patient and/or family; these include but are not limited to the risk of perforation, infection, bleeding, missed lesions, lack of diagnosis, severe illness requiring hospitalization, as well as anesthesia and sedation related illnesses.  The patient's history has been reviewed, patient examined, no change in status, and deemed stable for procedure.  The patient and/or family is agreeable to proceed.    Aloha Finner, MD [facility] Office # [phone]

## 2023-12-22 NOTE — Transfer of Care (Signed)
 Immediate Anesthesia Transfer of Care Note  Patient: Holly Mcdaniel  Procedure(s) Performed: ULTRASOUND, UPPER GI TRACT, ENDOSCOPIC EGD (ESOPHAGOGASTRODUODENOSCOPY)  Patient Location: PACU and Endoscopy Unit  Anesthesia Type:MAC  Level of Consciousness: awake, alert , oriented, and patient cooperative  Airway & Oxygen Therapy: Patient Spontanous Breathing and Patient connected to face mask oxygen  Post-op Assessment: Report given to RN and Post -op Vital signs reviewed and stable  Post vital signs: Reviewed and stable  Last Vitals:  Vitals Value Taken Time  BP 107/64 12/22/23 10:26  Temp    Pulse 56 12/22/23 10:29  Resp 20 12/22/23 10:29  SpO2 100 % 12/22/23 10:29  Vitals shown include unfiled device data.  Last Pain:  Vitals:   12/22/23 0817  TempSrc: Temporal  PainSc: 0-No pain         Complications: No notable events documented.

## 2023-12-22 NOTE — Telephone Encounter (Signed)
-----   Message from [facility] sent at 12/22/2023 10:49 AM EDT ----- Regarding: Followup Oskar Cretella, This patient needs ERCP for choledocholithiasis with me, next available. Send a Clearance Note to Fonda Pinta of Nephrology in [place] (6/24 Note from [facility]) to see if they will give approval for 1x Rectal Diclofenac 100 mg or 1x Rectal Indomethacin 100 mg dose to be given (she has stage 4 kidney disease). If we do not get approval, then will not give Rectal NSAID during ERCP attempt with thus accepting increased risk for pancreatitis during procedure.  Patient needs a EGD recall with Dr. San in 4 months.  MRI/MRCP recall under Dr. Rennis name in July 2026 for pancreas cancer screening high risk cohort.  Thanks. GM ----- Message ----- From: Wilhelmenia Aloha Raddle., MD Sent: 12/22/2023  10:50 AM EDT To: Odetta LITTIE Curly, RN; Sandor San GAILS, DO Subject: Followup                                       Ivan Maskell, This patient needs ERCP for choledocholithiasis with me, next available. Patient needs a EGD recall with Dr. San in 4 months. MRI/MRCP recall under Dr. Rennis name in July 2026 for pancreas cancer screening high risk cohort. Thanks. GM

## 2023-12-22 NOTE — Telephone Encounter (Signed)
 4 month EGD recall in epic.   1 year MRCP reminder in epic.

## 2023-12-22 NOTE — Op Note (Addendum)
 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 Estimated Blood Loss:     Estimated blood loss was minimal. Procedure:                Pre-Anesthesia Assessment:                           - Prior to the procedure, a History and Physical                            was performed, and patient medications and                            allergies were reviewed. The patient's tolerance of                            previous anesthesia was also reviewed. The risks                            and benefits of the procedure and the sedation                            options and risks were discussed with the patient.                            All questions were answered, and informed consent  was obtained. Prior Anticoagulants: The patient has                            taken no anticoagulant or antiplatelet agents. ASA                            Grade Assessment: II - A patient with mild systemic                            disease. After reviewing the risks and benefits,                            the patient was deemed in  satisfactory condition to                            undergo the procedure.                           After obtaining informed consent, the endoscope was                            passed under direct vision. Throughout the                            procedure, the patient's blood pressure, pulse, and                            oxygen saturations were monitored continuously. The                            [id number]-[id number] ([id number]) Olympus endoscope was introduced                            through the mouth, and advanced to the second part                            of duodenum. The [id number]-[id number] ([id number]) Olympus                            duodenoscope was introduced through the mouth, and                            advanced to the area of papilla. The [id number]                            ([id number]) Olympus linear ultrasound scope was                            introduced through the mouth, and advanced to the                            duodenum for ultrasound examination from the  stomach and duodenum. The upper EUS was                            accomplished without difficulty. The patient                            tolerated the procedure. Scope In: Scope Out: Findings:      ENDOSCOPIC FINDING: :      LA Grade B (one or more mucosal breaks greater than 5 mm, not extending       between the tops of two mucosal folds) esophagitis with no bleeding was       found in the distal esophagus.      No other gross lesions were noted in the entire esophagus.      The Z-line was irregular and was found 36 cm from the incisors.      A 2 cm hiatal hernia was present.      Patchy mildly erythematous mucosa without bleeding was found in the       gastric antrum.      No other gross lesions were noted in the entire examined stomach.      No gross lesions were noted in the duodenal bulb, in the first portion       of the duodenum and in the second portion of the duodenum.      The  major papilla was normal.      ENDOSONOGRAPHIC FINDING: :      Pancreatic parenchymal abnormalities were noted in the entire pancreas.       These consisted of lobularity without honeycombing and hyperechoic       strands. No evidence of any mass or lesion or cyst was found.      The diameter of the main pancreatic duct (MPD) measured:      - HOP 2.6 mm (head of pancreas)      - NOP 1.9 mm (neck of pancreas)      - BOP 1.2 mm (body of the pancreas)      - TOP 0.5 mm (tail of the pancreas).      There was dilation in the common bile duct (2.0 -> 6.4 mm) and in the       common hepatic duct (9.0 mm).      One stone was visualized endosonographically in the common bile duct.       The stone measured 7 mm in greatest dimension. The stone was round. It       was hyperechoic and characterized by shadowing.      Endosonographic imaging of the ampulla showed no intramural       (subepithelial) lesion.      Endosonographic imaging in the visualized portion of the liver showed no       mass.      No malignant-appearing lymph nodes were visualized in the celiac region       (level 20), peripancreatic region and porta hepatis region.      The celiac region was visualized. Impression:               EGD impression:                           - LA Grade B esophagitis with no bleeding found  distally. No other gross lesions in the entire                            esophagus. Z-line irregular, 36 cm from the                            incisors.                           - 2 cm hiatal hernia.                           - Erythematous mucosa in the antrum. No other gross                            lesions in the entire stomach.                           - No gross lesions in the duodenal bulb, in the                            first portion of the duodenum and in the second                            portion of the duodenum.                           - Normal major papilla.                            EUS impression:                           - Pancreatic parenchymal abnormalities consisting                            of lobularity and hyperechoic strands were noted in                            the entire pancreas. No evidence of any mass or                            lesion or cyst was found.                           - Main pancreatic duct (MPD) diameter was measured.                            Endosonographically, the MPD had a normal                            appearance.                           - There was dilation in the common bile duct and in  the common hepatic duct.                           - One stone was visualized endosonographically in                            the common bile duct.                           - No malignant-appearing lymph nodes were                            visualized in the celiac region (level 20),                            peripancreatic region and porta hepatis region. Moderate Sedation:      Not Applicable - Patient had care per Anesthesia. Recommendation:           - The patient will be observed post-procedure,                            until all discharge criteria are met.                           - Discharge patient to home.                           - Patient has a contact number available for                            emergencies. The signs and symptoms of potential                            delayed complications were discussed with the                            patient. Return to normal activities tomorrow.                            Written discharge instructions were provided to the                            patient.                           - Resume previous diet.                           - Observe patient's clinical course.                           - Pantoprazole  40 mg twice daily for next 2 months                            then may decrease to once daily.                            -  Will forward results to patient's primary                            gastroenterologist, to consider repeat upper                            endoscopy in 3 to 4 months versus clinical                            surveillance.                           - Follow-up HFP to be drawn today.                           - Will work on scheduling ERCP based on                            availability for attempted stone extraction.                           - Await path results.                           - For future surveillance for pancreatic cancer,                            alternating upper endoscopic ultrasound and                            cross-sectional imaging is recommended. Next                            MRI/MRCP would be in 7/26.                           - The findings and recommendations were discussed                            with the patient.                           - The findings and recommendations were discussed                            with the patient's family. Procedure Code(s):        --- Professional ---                           [phone], Esophagogastroduodenoscopy, flexible,                            transoral; with endoscopic ultrasound examination                            limited to the esophagus, stomach or duodenum, and  adjacent structures Diagnosis Code(s):        --- Professional ---                           K20.90, Esophagitis, unspecified without bleeding                           K22.89, Other specified disease of esophagus                           K44.9, Diaphragmatic hernia without obstruction or                            gangrene                           K31.89, Other diseases of stomach and duodenum                           K86.9, Disease of pancreas, unspecified                           K80.50, Calculus of bile duct without cholangitis                            or cholecystitis without obstruction                            I89.9, Noninfective disorder of lymphatic vessels                            and lymph nodes, unspecified                           Z12.89, Encounter for screening for malignant                            neoplasm of other sites                           Z15.09, Genetic susceptibility to other malignant                            neoplasm                           Z80.0, Family history of malignant neoplasm of                            digestive organs                           K83.8, Other specified diseases of biliary tract CPT copyright 2022 American Medical Association. All rights reserved. The codes documented in this report are preliminary and upon coder review may  be revised to meet current compliance requirements. Aloha Finner, MD 12/22/2023 10:49:12 AM Number of Addenda: 0

## 2023-12-22 NOTE — Anesthesia Postprocedure Evaluation (Signed)
 Anesthesia Post Note  Patient: Holly Mcdaniel  Procedure(s) Performed: ULTRASOUND, UPPER GI TRACT, ENDOSCOPIC EGD (ESOPHAGOGASTRODUODENOSCOPY)     Patient location during evaluation: Endoscopy Anesthesia Type: MAC Level of consciousness: awake and alert Pain management: pain level controlled Vital Signs Assessment: post-procedure vital signs reviewed and stable Respiratory status: spontaneous breathing, nonlabored ventilation, respiratory function stable and patient connected to nasal cannula oxygen Cardiovascular status: blood pressure returned to baseline and stable Postop Assessment: no apparent nausea or vomiting Anesthetic complications: no   No notable events documented.  Last Vitals:  Vitals:   12/22/23 1026 12/22/23 1030  BP: (P) 107/64 (P) 110/60  Pulse: 60 (!) 56  Resp: (P) 20 20  Temp: (!) (P) 36.1 C   SpO2: 99% 100%    Last Pain:  Vitals:   12/22/23 1030  TempSrc:   PainSc: (P) 0-No pain                 Garnette DELENA Gab

## 2023-12-23 ENCOUNTER — Ambulatory Visit: Payer: Self-pay | Admitting: Gastroenterology

## 2023-12-23 ENCOUNTER — Other Ambulatory Visit: Payer: Self-pay

## 2023-12-23 DIAGNOSIS — K8050 Calculus of bile duct without cholangitis or cholecystitis without obstruction: Secondary | ICD-10-CM

## 2023-12-23 NOTE — Telephone Encounter (Signed)
 ERCP has been set up for 03/02/24 at 1145 am at [facility] with GM

## 2023-12-24 LAB — SURGICAL PATHOLOGY

## 2023-12-25 ENCOUNTER — Encounter (HOSPITAL_COMMUNITY): Payer: Self-pay | Admitting: Gastroenterology

## 2023-12-28 DIAGNOSIS — F4310 Post-traumatic stress disorder, unspecified: Secondary | ICD-10-CM | POA: Diagnosis not present

## 2023-12-28 NOTE — Telephone Encounter (Signed)
 ERCP scheduled, pt instructed and medications reviewed.  Patient instructions mailed to home.  Patient to call with any questions or concerns.  I have sent a request to Dr Porfirio for medication approval.

## 2024-01-05 ENCOUNTER — Telehealth: Payer: Self-pay

## 2024-01-05 DIAGNOSIS — N1831 Chronic kidney disease, stage 3a: Secondary | ICD-10-CM | POA: Diagnosis not present

## 2024-01-05 DIAGNOSIS — R3121 Asymptomatic microscopic hematuria: Secondary | ICD-10-CM | POA: Diagnosis not present

## 2024-01-05 DIAGNOSIS — I10 Essential (primary) hypertension: Secondary | ICD-10-CM | POA: Diagnosis not present

## 2024-01-05 DIAGNOSIS — R809 Proteinuria, unspecified: Secondary | ICD-10-CM | POA: Diagnosis not present

## 2024-01-05 DIAGNOSIS — N02B9 Other recurrent and persistent immunoglobulin A nephropathy: Secondary | ICD-10-CM | POA: Diagnosis not present

## 2024-01-05 NOTE — Telephone Encounter (Signed)
-----   Message from Nurse Suri Tafolla P sent at 12/28/2023 10:32 AM EDT ----- Need med approval from [facility]

## 2024-01-05 NOTE — Telephone Encounter (Signed)
 Send a Clearance Note to Fonda Pinta of Nephrology in [place] (6/24 Note from [facility]) to see if they will give approval for 1x Rectal Diclofenac 100 mg or 1x Rectal Indomethacin 100 mg dose to be given (she has stage 4 kidney disease). If we do not get approval, then will not give Rectal NSAID during ERCP attempt with thus accepting increased risk for pancreatitis during procedure.

## 2024-01-05 NOTE — Telephone Encounter (Signed)
 Resent request to Dr Porfirio to [phone]

## 2024-01-06 DIAGNOSIS — I10 Essential (primary) hypertension: Secondary | ICD-10-CM | POA: Diagnosis not present

## 2024-01-06 DIAGNOSIS — M7732 Calcaneal spur, left foot: Secondary | ICD-10-CM | POA: Diagnosis not present

## 2024-01-06 DIAGNOSIS — M19071 Primary osteoarthritis, right ankle and foot: Secondary | ICD-10-CM | POA: Diagnosis not present

## 2024-01-06 DIAGNOSIS — M7731 Calcaneal spur, right foot: Secondary | ICD-10-CM | POA: Diagnosis not present

## 2024-01-06 DIAGNOSIS — M19072 Primary osteoarthritis, left ankle and foot: Secondary | ICD-10-CM | POA: Diagnosis not present

## 2024-01-06 DIAGNOSIS — M170 Bilateral primary osteoarthritis of knee: Secondary | ICD-10-CM | POA: Diagnosis not present

## 2024-01-07 NOTE — Telephone Encounter (Signed)
 Received a fax from Dr Porfirio with the ok for a one time dose of rectal diclofenac 100 mg or 1 dose of rectal indomethacin 100 mg for ERCP. Will have the response scanned into My Chart

## 2024-01-08 NOTE — Telephone Encounter (Signed)
Thanks Patty for update. GM

## 2024-01-20 DIAGNOSIS — R0981 Nasal congestion: Secondary | ICD-10-CM | POA: Diagnosis not present

## 2024-01-20 DIAGNOSIS — R059 Cough, unspecified: Secondary | ICD-10-CM | POA: Diagnosis not present

## 2024-01-20 DIAGNOSIS — R070 Pain in throat: Secondary | ICD-10-CM | POA: Diagnosis not present

## 2024-01-24 DIAGNOSIS — F4310 Post-traumatic stress disorder, unspecified: Secondary | ICD-10-CM | POA: Diagnosis not present

## 2024-02-02 ENCOUNTER — Encounter: Payer: Self-pay | Admitting: Gastroenterology

## 2024-02-02 ENCOUNTER — Ambulatory Visit: Admitting: Gastroenterology

## 2024-02-02 VITALS — BP 110/70 | HR 88 | Ht 65.25 in | Wt 226.2 lb

## 2024-02-02 DIAGNOSIS — K219 Gastro-esophageal reflux disease without esophagitis: Secondary | ICD-10-CM | POA: Diagnosis not present

## 2024-02-02 DIAGNOSIS — K449 Diaphragmatic hernia without obstruction or gangrene: Secondary | ICD-10-CM | POA: Diagnosis not present

## 2024-02-02 DIAGNOSIS — Z800 Family history of malignant neoplasm of digestive organs: Secondary | ICD-10-CM

## 2024-02-02 DIAGNOSIS — Z1501 Genetic susceptibility to malignant neoplasm of breast: Secondary | ICD-10-CM | POA: Diagnosis not present

## 2024-02-02 DIAGNOSIS — Z860100 Personal history of colon polyps, unspecified: Secondary | ICD-10-CM

## 2024-02-02 DIAGNOSIS — K8050 Calculus of bile duct without cholangitis or cholecystitis without obstruction: Secondary | ICD-10-CM

## 2024-02-02 DIAGNOSIS — K2100 Gastro-esophageal reflux disease with esophagitis, without bleeding: Secondary | ICD-10-CM

## 2024-02-02 DIAGNOSIS — Z1509 Genetic susceptibility to other malignant neoplasm: Secondary | ICD-10-CM

## 2024-02-02 NOTE — Progress Notes (Signed)
 Chief Complaint:    ATM gene mutation, Pancreatic Cancer screening    GI History: Holly Mcdaniel is a 58 y.o. 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  Additionally, modified colon cancer screening to every 5 years due to elevated risk with ATM gene mutation.   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     - 12/2022: Hgb A1c 5.9% in 08/2023 - 04/2023: MRI/MRCP with normal-appearing pancreas.  Mild diffuse hepatic steatosis.  Normal liver enzymes - 11/2023: EUS: LA Grade B erosive esophagitis, 2 cm hiatal hernia, mild antral gastritis.  Pancreatic parenchymal abnormalities noted throughout the pancreas consisting of lobularity without honeycombing and hyperechoic strands.  No evidence of mass, lesion, or cyst and PD was normal.  There was dilation of the common bile duct (2 --> 6.4 mm) and in the common hepatic duct (9 mm).  1 stone visualized in the common bile duct measuring 7 mm.  Normal-appearing ampulla.  No malignant appearing lymph nodes.  Started on pantoprazole  40 mg twice daily x 8 weeks with recommendation to repeat upper endoscopy in 3-4 months and scheduled for ERCP for stone extraction with recommendation for MRI/MRCP for continued PC screening in 11/2024. - 11/2023: Normal liver enzymes     Endoscopic History: - 02/11/2015: Colonoscopy: 6 mm ascending colon polyp, 2 mm transverse colon polyp, 2 mm ascending colon polyp, 3-4 mm descending colon polyp, 2 small 2 mm descending colon polyps, small internal hemorrhoids.  No path available for review - 08/04/2019: Colonoscopy: Internal  hemorrhoids, otherwise normal.    HPI:     Patient is a 58 y.o. female presenting to the Gastroenterology Clinic for routine follow-up.  Was last seen by me in the office on 09/10/2023 and was referred for EUS for continued PC screening as above.  EUS was notable for 7 mm stone in the common bile duct with mild upstream dilatation.  She is scheduled for ERCP on 03/02/2024 for stone extraction.  Her enzymes were otherwise normal and she was without GI symptoms.  EUS did show pancreatic lobularity without honeycombing or other high risk features, and otherwise recommended repeat PC screening via MRI/MRCP in 1 year (11/2024).  While she previously reported not having any GI symptoms, in retrospect, she has been taking Tums daily for many years for indigestion and heartburn.  No dysphagia.  She admits that she had been downplaying her symptoms.  EGD at the time of her EUS with LA Grade B erosive esophagitis and was started on Protonix  40 mg  bid.  Since then, has had no reflux symptoms and has not taken any Tums.  Does occasionally miss the evening dose but overall doing very well.    She is otherwise without any GI symptoms or active issues today.  Review of systems:     No chest pain, no SOB, no fevers, no urinary sx   Past Medical History:  Diagnosis Date   Allergy    Anxiety    Arthritis 2021   ATM gene mutation positive    Breast mass    Childhood asthma    no problems as  an adult   Chronic kidney disease    Depression    Dyspareunia    Hx of adenomatous colonic polyps    Hypercholesteremia    Thyroid disease    Vasculitis (HCC)     Patient's surgical history, family medical history, social history, medications and allergies were all reviewed in Epic    Current Outpatient Medications  Medication Sig Dispense Refill   Cholecalciferol (VITAMIN D3) 2000 UNITS capsule Take 2,000 Units by mouth daily.     clotrimazole -betamethasone  (LOTRISONE ) cream Apply 1 Application topically 2  (two) times daily. Use for 2 weeks at a time as needed. 30 g 0   fexofenadine (ALLEGRA) 180 MG tablet Take 180 mg by mouth daily.     levothyroxine (SYNTHROID, LEVOTHROID) 50 MCG tablet Take 50 mcg by mouth daily.     lisinopril  (ZESTRIL ) 10 MG tablet Take 1 tablet (10 mg total) by mouth daily. 90 tablet 1   nystatin  (MYCOSTATIN /NYSTOP ) powder Apply 1 Application topically 3 (three) times daily. Apply to affected area for up to 7 days 15 g 2   pantoprazole  (PROTONIX ) 40 MG tablet Take 1 tablet (40 mg total) by mouth 2 (two) times daily before a meal. 60 tablet 6   rosuvastatin  (CRESTOR ) 20 MG tablet Take 1 tablet (20 mg total) by mouth at bedtime. 90 tablet 1   No current facility-administered medications for this visit.    Physical Exam:     LMP 06/01/2002 (LMP Unknown)   GENERAL:  Pleasant female in NAD PSYCH: : Cooperative, normal affect Musculoskeletal:  Normal muscle tone, normal strength NEURO: Alert and oriented x 3, no focal neurologic deficits   IMPRESSION and PLAN:    1) ATM gene mutation 2) Family history of Pancreatic Cancer Up-to-date on PC screening protocol.   - MRI/MRCP in 11/2024 for ongoing PC screening   3) History of colon polyps Patients with ATM gene mutation carrier and increased risk for colon cancer and should undergo colonoscopy every 5 years, or potentially sooner depending on presence of polyps or additional family history. - Last colonoscopy was 07/2019 and normal.  Prior to that, several subcentimeter polyps (path unknown) in 01/2015 - Repeat colonoscopy in 07/2024 for ongoing screening/surveillance  4) Choledocholithiasis 5) Dilated common bile duct/common hepatic duct 7 mm stone incidentally noted at the time of screening EUS.  Otherwise no biliary symptoms and normal liver enzymes.  Ampulla otherwise appeared normal on EUS. - Scheduled for ERCP on 03/02/2024 with Dr. Wilhelmenia for stone extraction  6) GERD with erosive esophagitis 7) Hiatal  hernia Good clinical response to high-dose Protonix .  Discussed the role/utility of repeat upper endoscopy to evaluate for appropriate mucosal healing.  Since she has had a robust clinical response, she would like to manage medically without need for repeat upper endoscopy at this juncture. - Continue Protonix  40 mg bid for another 3 weeks, then reduce to 40 mg daily (remove evening dose).  I asked her to message me 2 weeks afterwards to let me know how she is doing.  If symptoms well-controlled, we will continue with 40 mg daily with plan to eventually titrate down to 20 mg to get on lowest effective dose - Continue monitoring renal function closely with her Nephrologist   8) CKD - Continue regular follow-up with Dr. Porfirio in the Nephrology Clinic  I spent 30 minutes of time, including in depth chart review, independent review of results as outlined above, communicating results with the patient directly, face-to-face time with the patient, coordinating  care, and ordering studies and medications as appropriate, and documentation.       Sandor GAILS Timberly Yott ,DO, FACG 02/02/2024, 8:12 AM

## 2024-02-02 NOTE — Patient Instructions (Addendum)
 _______________________________________________________  If your blood pressure at your visit was 140/90 or greater, please contact your primary care physician to follow up on this.  _______________________________________________________  If you are age 60 or older, your body mass index should be between 23-30. Your Body mass index is 37.35 kg/m. If this is out of the aforementioned range listed, please consider follow up with your Primary Care Provider.  If you are age 29 or younger, your body mass index should be between 19-25. Your Body mass index is 37.35 kg/m. If this is out of the aformentioned range listed, please consider follow up with your Primary Care Provider.   ________________________________________________________  The [facility] providers would like to encourage you to use MYCHART to communicate with providers for non-urgent requests or questions.  Due to long hold times on the telephone, sending your provider a message by [facility] may be a faster and more efficient way to get a response.  Please allow 48 business hours for a response.  Please remember that this is for non-urgent requests.  _______________________________________________________  Rosine will need an MRI/MRCP abdomen in July 2026.  We will contact you to schedule this appointment.  You will need to follow up in our office in 1 year.  We will contact you to schedule this appointment.  [facility] is using a team-based approach to care.  Your team is made up of your doctor and two to three APPS. Our APPS (Nurse Practitioners and Physician Assistants) work with your physician to ensure care continuity for you. They are fully qualified to address your health concerns and develop a treatment plan. They communicate directly with your gastroenterologist to care for you. Seeing the Advanced Practice Practitioners on your physician's team can help you by facilitating care more promptly, often allowing for  earlier appointments, access to diagnostic testing, procedures, and other specialty referrals.   It was a pleasure to see you today!  Vito Cirigliano, D.O.

## 2024-02-13 ENCOUNTER — Ambulatory Visit
Admission: RE | Admit: 2024-02-13 | Discharge: 2024-02-13 | Disposition: A | Discharge status: Home / Self Care | Source: Ambulatory Visit | Attending: Hematology and Oncology | Admitting: Hematology and Oncology

## 2024-02-13 DIAGNOSIS — Z1501 Genetic susceptibility to malignant neoplasm of breast: Secondary | ICD-10-CM

## 2024-02-13 DIAGNOSIS — Z1239 Encounter for other screening for malignant neoplasm of breast: Secondary | ICD-10-CM | POA: Diagnosis not present

## 2024-02-13 MED ORDER — GADOPICLENOL 0.5 MMOL/ML IV SOLN
10.0000 mL | Freq: Once | INTRAVENOUS | Status: AC | PRN
  Administered 2024-02-13: 10 mL via INTRAVENOUS

## 2024-02-15 ENCOUNTER — Encounter: Payer: Self-pay | Admitting: Internal Medicine

## 2024-02-15 NOTE — Telephone Encounter (Signed)
 Have not seen a fax. Please advise

## 2024-02-22 DIAGNOSIS — F4310 Post-traumatic stress disorder, unspecified: Secondary | ICD-10-CM | POA: Diagnosis not present

## 2024-02-23 ENCOUNTER — Other Ambulatory Visit: Payer: Self-pay | Admitting: Internal Medicine

## 2024-02-24 ENCOUNTER — Inpatient Hospital Stay: Payer: 59 | Attending: Hematology and Oncology | Admitting: Hematology and Oncology

## 2024-02-24 ENCOUNTER — Encounter (HOSPITAL_COMMUNITY): Payer: Self-pay | Admitting: Gastroenterology

## 2024-02-24 VITALS — BP 130/73 | HR 72 | Temp 98.0°F | Resp 18 | Wt 229.5 lb

## 2024-02-24 DIAGNOSIS — Z1509 Genetic susceptibility to other malignant neoplasm: Secondary | ICD-10-CM | POA: Diagnosis not present

## 2024-02-24 DIAGNOSIS — Z1501 Genetic susceptibility to malignant neoplasm of breast: Secondary | ICD-10-CM | POA: Insufficient documentation

## 2024-02-24 DIAGNOSIS — N183 Chronic kidney disease, stage 3 unspecified: Secondary | ICD-10-CM | POA: Insufficient documentation

## 2024-02-24 MED ORDER — PANTOPRAZOLE SODIUM 40 MG PO TBEC: 40.0000 mg | DELAYED_RELEASE_TABLET | Freq: Every day | ORAL | Status: AC

## 2024-02-24 NOTE — Assessment & Plan Note (Signed)
 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 Stage III chronic kidney disease  Return to clinic in 1 year for follow-up

## 2024-02-24 NOTE — Progress Notes (Signed)
 Patient Care Team: Holly Almarie LABOR, MD as PCP - General (Internal Medicine) Holly JAYSON Nikki Bobie FORBES, MD as Consulting Physician (Obstetrics and Gynecology)  DIAGNOSIS:  Encounter Diagnosis  Name Primary?   ATM gene mutation positive Yes    CHIEF COMPLIANT: Surveillance for being high risk for breast cancer  HISTORY OF PRESENT ILLNESS: History of Present Illness Holly Mcdaniel is a 58 year old female who presents for routine follow-up after recent MRI and endoscopy.  An endoscopy and ultrasound revealed a stone in the bile duct near the pancreas, likely a remnant from her previous cholecystectomy. She is scheduled for a follow-up procedure next week to address this. She experiences claustrophobia, which affects her ability to undergo MRIs comfortably. During her last MRI, she managed her anxiety without Valium  by using affirmations, despite a minor panic attack. Her medications are stable with a recent adjustment to reduce one medication to once daily. She reports no new symptoms and no issues with her current medications.     ALLERGIES:  is allergic to covid-19 (mrna) vaccine, venlafaxine, and erythromycin.  MEDICATIONS:  Current Outpatient Medications  Medication Sig Dispense Refill   Calcium  Carb-Cholecalciferol (CALCIUM  1000 + D) 1000-20 MG-MCG TABS Take 1 tablet by mouth 2 (two) times daily.     Cholecalciferol (VITAMIN D3) 2000 UNITS capsule Take 2,000 Units by mouth daily.     clotrimazole -betamethasone  (LOTRISONE ) cream Apply 1 Application topically 2 (two) times daily. Use for 2 weeks at a time as needed. 30 g 0   fexofenadine (ALLEGRA) 180 MG tablet Take 180 mg by mouth daily.     levothyroxine (SYNTHROID, LEVOTHROID) 50 MCG tablet Take 50 mcg by mouth daily.     lisinopril  (ZESTRIL ) 10 MG tablet TAKE 1 TABLET BY MOUTH EVERY DAY 90 tablet 1   nystatin  (MYCOSTATIN /NYSTOP ) powder Apply 1 Application topically 3 (three) times daily. Apply to affected area for up  to 7 days 15 g 2   pantoprazole  (PROTONIX ) 40 MG tablet Take 1 tablet (40 mg total) by mouth 2 (two) times daily before a meal. 60 tablet 6   rosuvastatin  (CRESTOR ) 20 MG tablet Take 1 tablet (20 mg total) by mouth at bedtime. 90 tablet 1   No current facility-administered medications for this visit.    PHYSICAL EXAMINATION: ECOG PERFORMANCE STATUS: 1 - Symptomatic but completely ambulatory  Vitals:   02/24/24 1518  BP: 130/73  Pulse: 72  Resp: 18  Temp: 98 F (36.7 C)  SpO2: 99%   Filed Weights   02/24/24 1518  Weight: 229 lb 8 oz (104.1 kg)    Physical Exam   (exam performed in the presence of a chaperone)  LABORATORY DATA:  I have reviewed the data as listed    Latest Ref Rng & Units 12/22/2023   11:31 AM 04/13/2023    7:53 AM  CMP  Total Protein 6.5 - 8.1 g/dL 7.2  7.1   Total Bilirubin 0.0 - 1.2 mg/dL 0.9  0.3   Alkaline Phos 38 - 126 U/L 55  61   AST 15 - 41 U/L 15  18   ALT 0 - 44 U/L 19  22     No results found for: WBC, HGB, HCT, MCV, PLT, NEUTROABS  ASSESSMENT & PLAN:  ATM gene mutation positive Genetic testing [facility] 12/21/2022: 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 San gastroenterology for pancreatic and colon cancer surveillance. Stage III chronic kidney disease  She had a first grandbaby bone recently and the second 1 grandbaby will be born in December.  She is super excited about her grandchildren.  Return to clinic in 1 year for follow-up ------------------------------------- Assessment and Plan Assessment & Plan ATM gene mutation with genetic susceptibility to malignant neoplasm of breast Recent MRI normal, no malignancy detected. Anxiety and claustrophobia during MRIs noted. - Continue annual breast MRI screenings. - She will need Valium   for anxiety management before MRIs         No orders of the defined types were placed in this encounter.  The patient has a good understanding of the overall plan. she agrees with it. she will call with any problems that may develop before the next visit here. Total time spent: 30 mins including face to face time and time spent for planning, charting and co-ordination of care   Holly K Solimar Maiden, MD 02/24/24

## 2024-02-29 ENCOUNTER — Telehealth: Payer: Self-pay

## 2024-02-29 NOTE — Telephone Encounter (Signed)
 Copied from CRM [id number]. Topic: General - Other >> Feb 29, 2024  4:50 PM Zebedee SAUNDERS wrote: Reason for CRM: Received call from Dr. Fonda MICAEL [facility] ph: [phone] regarding medication management for Dr. Rollene.

## 2024-03-02 ENCOUNTER — Telehealth: Payer: Self-pay

## 2024-03-02 ENCOUNTER — Encounter (HOSPITAL_COMMUNITY)
Admission: RE | Disposition: A | Discharge status: Home / Self Care | Payer: Self-pay | Source: Home / Self Care | Attending: Gastroenterology

## 2024-03-02 ENCOUNTER — Ambulatory Visit (HOSPITAL_COMMUNITY)
Admission: RE | Admit: 2024-03-02 | Discharge: 2024-03-02 | Disposition: A | Discharge status: Home / Self Care | Attending: Gastroenterology | Admitting: Gastroenterology

## 2024-03-02 ENCOUNTER — Ambulatory Visit (HOSPITAL_COMMUNITY)

## 2024-03-02 ENCOUNTER — Other Ambulatory Visit: Payer: Self-pay

## 2024-03-02 ENCOUNTER — Encounter (HOSPITAL_COMMUNITY): Payer: Self-pay | Admitting: Gastroenterology

## 2024-03-02 ENCOUNTER — Ambulatory Visit (HOSPITAL_COMMUNITY): Admitting: Anesthesiology

## 2024-03-02 DIAGNOSIS — K8050 Calculus of bile duct without cholangitis or cholecystitis without obstruction: Secondary | ICD-10-CM | POA: Diagnosis present

## 2024-03-02 DIAGNOSIS — R932 Abnormal findings on diagnostic imaging of liver and biliary tract: Secondary | ICD-10-CM | POA: Insufficient documentation

## 2024-03-02 DIAGNOSIS — Z9889 Other specified postprocedural states: Secondary | ICD-10-CM | POA: Diagnosis not present

## 2024-03-02 DIAGNOSIS — T85528A Displacement of other gastrointestinal prosthetic devices, implants and grafts, initial encounter: Secondary | ICD-10-CM

## 2024-03-02 DIAGNOSIS — Z9049 Acquired absence of other specified parts of digestive tract: Secondary | ICD-10-CM | POA: Diagnosis not present

## 2024-03-02 DIAGNOSIS — K8040 Calculus of bile duct with cholecystitis, unspecified, without obstruction: Secondary | ICD-10-CM | POA: Diagnosis not present

## 2024-03-02 DIAGNOSIS — K838 Other specified diseases of biliary tract: Secondary | ICD-10-CM | POA: Diagnosis not present

## 2024-03-02 DIAGNOSIS — I10 Essential (primary) hypertension: Secondary | ICD-10-CM | POA: Diagnosis not present

## 2024-03-02 HISTORY — PX: ERCP: SHX5425

## 2024-03-02 SURGERY — ERCP, WITH INTERVENTION IF INDICATED
Anesthesia: General

## 2024-03-02 MED ORDER — FENTANYL CITRATE (PF) 100 MCG/2ML IJ SOLN
INTRAMUSCULAR | Status: AC
  Filled 2024-03-02: qty 2

## 2024-03-02 MED ORDER — SODIUM CHLORIDE 0.9 % IV SOLN
INTRAVENOUS | Status: DC | PRN
  Administered 2024-03-02: 3 mL

## 2024-03-02 MED ORDER — AMISULPRIDE (ANTIEMETIC) 5 MG/2ML IV SOLN
INTRAVENOUS | Status: AC
  Filled 2024-03-02: qty 4

## 2024-03-02 MED ORDER — MIDAZOLAM HCL 2 MG/2ML IJ SOLN
INTRAMUSCULAR | Status: DC | PRN
  Administered 2024-03-02: 2 mg via INTRAVENOUS

## 2024-03-02 MED ORDER — CIPROFLOXACIN IN D5W 400 MG/200ML IV SOLN
INTRAVENOUS | Status: AC
  Filled 2024-03-02: qty 200

## 2024-03-02 MED ORDER — ROCURONIUM BROMIDE 10 MG/ML (PF) SYRINGE
PREFILLED_SYRINGE | INTRAVENOUS | Status: DC | PRN
  Administered 2024-03-02: 60 mg via INTRAVENOUS
  Administered 2024-03-02: 10 mg via INTRAVENOUS

## 2024-03-02 MED ORDER — GLUCAGON HCL RDNA (DIAGNOSTIC) 1 MG IJ SOLR
INTRAMUSCULAR | Status: DC | PRN
Start: 2024-03-02 — End: 2024-03-02
  Administered 2024-03-02 (×6): .25 mg via INTRAVENOUS

## 2024-03-02 MED ORDER — GLUCAGON HCL RDNA (DIAGNOSTIC) 1 MG IJ SOLR
INTRAMUSCULAR | Status: AC
  Filled 2024-03-02: qty 2

## 2024-03-02 MED ORDER — SODIUM CHLORIDE 0.9 % IV SOLN: INTRAVENOUS | Status: DC

## 2024-03-02 MED ORDER — LIDOCAINE 2% (20 MG/ML) 5 ML SYRINGE
INTRAMUSCULAR | Status: DC | PRN
  Administered 2024-03-02: 60 mg via INTRAVENOUS

## 2024-03-02 MED ORDER — DICLOFENAC SUPPOSITORY 100 MG
RECTAL | Status: DC | PRN
Start: 2024-03-02 — End: 2024-03-02
  Administered 2024-03-02: 100 mg via RECTAL

## 2024-03-02 MED ORDER — LACTATED RINGERS IV SOLN
INTRAVENOUS | Status: AC | PRN
  Administered 2024-03-02: 20 mL/h via INTRAVENOUS

## 2024-03-02 MED ORDER — SUGAMMADEX SODIUM 200 MG/2ML IV SOLN
INTRAVENOUS | Status: DC | PRN
  Administered 2024-03-02: 200 mg via INTRAVENOUS

## 2024-03-02 MED ORDER — CIPROFLOXACIN IN D5W 400 MG/200ML IV SOLN
INTRAVENOUS | Status: DC | PRN
  Administered 2024-03-02: 400 mg via INTRAVENOUS

## 2024-03-02 MED ORDER — FENTANYL CITRATE (PF) 250 MCG/5ML IJ SOLN
INTRAMUSCULAR | Status: DC | PRN
  Administered 2024-03-02 (×2): 50 ug via INTRAVENOUS

## 2024-03-02 MED ORDER — MIDAZOLAM HCL 2 MG/2ML IJ SOLN
INTRAMUSCULAR | Status: AC
  Filled 2024-03-02: qty 2

## 2024-03-02 MED ORDER — VASOPRESSIN 20 UNIT/ML IV SOLN
INTRAVENOUS | Status: AC
  Filled 2024-03-02: qty 1

## 2024-03-02 MED ORDER — DICLOFENAC SUPPOSITORY 100 MG
RECTAL | Status: AC
  Filled 2024-03-02: qty 1

## 2024-03-02 MED ORDER — PHENYLEPHRINE 80 MCG/ML (10ML) SYRINGE FOR IV PUSH (FOR BLOOD PRESSURE SUPPORT)
PREFILLED_SYRINGE | INTRAVENOUS | Status: DC | PRN
  Administered 2024-03-02 (×3): 80 ug via INTRAVENOUS
  Administered 2024-03-02: 40 ug via INTRAVENOUS

## 2024-03-02 MED ORDER — PROPOFOL 10 MG/ML IV BOLUS
INTRAVENOUS | Status: DC | PRN
Start: 2024-03-02 — End: 2024-03-02
  Administered 2024-03-02: 200 mg via INTRAVENOUS

## 2024-03-02 MED ORDER — ONDANSETRON HCL 4 MG/2ML IJ SOLN
INTRAMUSCULAR | Status: DC | PRN
  Administered 2024-03-02: 4 mg via INTRAVENOUS

## 2024-03-02 MED ORDER — PROPOFOL 10 MG/ML IV BOLUS
INTRAVENOUS | Status: AC
  Filled 2024-03-02: qty 20

## 2024-03-02 MED ORDER — AMISULPRIDE (ANTIEMETIC) 5 MG/2ML IV SOLN
10.0000 mg | Freq: Once | INTRAVENOUS | Status: AC
  Administered 2024-03-02: 10 mg via INTRAVENOUS

## 2024-03-02 MED ORDER — DEXAMETHASONE SODIUM PHOSPHATE 10 MG/ML IJ SOLN
INTRAMUSCULAR | Status: DC | PRN
  Administered 2024-03-02: 10 mg via INTRAVENOUS

## 2024-03-02 NOTE — Progress Notes (Signed)
 Pt's left upper lip swollen following ERCP, no laceration or bleeding. Dr Merla aware. Pt ok for discharge home.   Clotilda Schmitz, RN

## 2024-03-02 NOTE — Discharge Instructions (Signed)

## 2024-03-02 NOTE — Telephone Encounter (Signed)
-----   Message from [facility] sent at 03/02/2024  1:33 PM EDT ----- Regarding: Followup Stran Raper, This patient needs a 2-week KUB to follow-up the pancreas stent we placed during today's ERCP. Patient will need a repeat ERCP attempt with me in the next 4 to 6 weeks. Please contact her tomorrow or early next week to go over things. Thanks. GM

## 2024-03-02 NOTE — H&P (Signed)
 GASTROENTEROLOGY PROCEDURE H&P NOTE   Primary Care Physician: Rollene Almarie LABOR, MD  HPI: Holly Mcdaniel is a 58 y.o. female who presents for ERCP for attempt at removal of choledocholithiasis noted on recent EUS.  Past Medical History:  Diagnosis Date   Allergy    Anxiety    Arthritis 2021   ATM gene mutation positive    Breast mass    Childhood asthma    no problems as an adult   Chronic kidney disease    Depression    Dyspareunia    Hx of adenomatous colonic polyps    Hypercholesteremia    Thyroid disease    Vasculitis    Past Surgical History:  Procedure Laterality Date   ABDOMINAL HYSTERECTOMY  2004   TVH--still has ovaries   BLADDER SUSPENSION     breast biopsy Left 12/2014   fibroma   BREAST BIOPSY Left 2016   benign   BREAST SURGERY  12/2014   Benign Lt.breast bx   CARPAL TUNNEL RELEASE Bilateral 2017   CHOLECYSTECTOMY  2015   COLONOSCOPY  02/11/2015   [facility]ef[facility] - hx polyps   ESOPHAGOGASTRODUODENOSCOPY N/A 12/22/2023   Procedure: EGD (ESOPHAGOGASTRODUODENOSCOPY);  Surgeon: Wilhelmenia Aloha Raddle., MD;  Location: [facility];  Service: Gastroenterology;  Laterality: N/A;   EUS N/A 12/22/2023   Procedure: ULTRASOUND, UPPER GI TRACT, ENDOSCOPIC;  Surgeon: Wilhelmenia Aloha Raddle., MD;  Location: [facility];  Service: Gastroenterology;  Laterality: N/A;   EYE SURGERY  2022   FOOT FRACTURE SURGERY Right 11/2013   MOUTH SURGERY     gum graft x 4   REFRACTIVE SURGERY     No current facility-administered medications for this encounter.   No current facility-administered medications for this encounter. Allergies  Allergen Reactions   Covid-19 (Mrna) Vaccine Other (See Comments)    Vasculitis Flare up   Venlafaxine Other (See Comments)    Hot feeling Hot feeling Hot feeling Hot feeling   Erythromycin Hives and Rash   Family History  Problem Relation Age of Onset   Diabetes Mother        borderline   Hypertension Mother    Thyroid disease  Mother    Dementia Mother    Arthritis Mother    Depression Mother    Hearing loss Mother    Heart disease Mother    Kidney disease Mother    Pancreatic cancer Father 68       d. 29   Cancer Father    Colon polyps Sister 53       precancer; unknown #; pat half sister   Heart failure Brother    Hypertension Brother    Heart disease Brother    Kidney disease Brother    Heart failure Brother    Asthma Brother    Lung cancer Maternal Aunt 45   Kidney cancer Maternal Uncle 23   Colon cancer Maternal Uncle 65   Prostate cancer Maternal Uncle 60   Colon cancer Maternal Uncle 69   Breast cancer Paternal Aunt 43       lumpectomy   Melanoma Paternal Aunt 4       arm   Breast cancer Cousin        mat female cousin; dx 40s   Bone cancer Cousin 77       mat female cousin   Skin cancer Cousin 68       mat female cousin; unknown type   Cancer Other  PGM's sister; unk type; d. 33s   Kidney disease Brother    Liver cancer Neg Hx    Esophageal cancer Neg Hx    Social History   Socioeconomic History   Marital status: Married    Spouse name: Not on file   Number of children: 3   Years of education: Not on file   Highest education level: Associate degree: academic program  Occupational History   Occupation: city Solicitor  Tobacco Use   Smoking status: Former    Current packs/day: 0.00    Average packs/day: 0.3 packs/day for 10.0 years (2.5 ttl pk-yrs)    Types: Cigarettes    Start date: 06/01/1989    Quit date: 06/01/2009    Years since quitting: 14.7   Smokeless tobacco: Never   Tobacco comments:    socially  Vaping Use   Vaping status: Never Used  Substance and Sexual Activity   Alcohol  use: Yes    Alcohol /week: 1.0 standard drink of alcohol     Types: 1 Glasses of wine per week    Comment: Less than one a week   Drug use: No   Sexual activity: Yes    Partners: Male    Birth control/protection: Post-menopausal, Surgical    Comment: hysterectomy  Other Topics Concern    Not on file  Social History Narrative   Not on file   Social Drivers of Health   Financial Resource Strain: Low Risk  (01/03/2024)   Received from [facility]   Overall Financial Resource Strain (CARDIA)    How hard is it for you to pay for the very basics like food, housing, medical care, and heating?: Not hard at all  Food Insecurity: No Food Insecurity (01/03/2024)   Received from [facility]   Hunger Vital Sign    Within the past 12 months, you worried that your food would run out before you got the money to buy more.: Never true    Within the past 12 months, the food you bought just didn't last and you didn't have money to get more.: Never true  Transportation Needs: No Transportation Needs (01/03/2024)   Received from [facility] - Transportation    In the past 12 months, has lack of transportation kept you from medical appointments or from getting medications?: No    In the past 12 months, has lack of transportation kept you from meetings, work, or from getting things needed for daily living?: No  Physical Activity: Inactive (01/03/2024)   Received from [facility]   Exercise Vital Sign    On average, how many days per week do you engage in moderate to strenuous exercise (like a brisk walk)?: 0 days    Minutes of Exercise per Session: Not on file  Stress: No Stress Concern Present (01/03/2024)   Received from [facility] - Occupational Stress Questionnaire    Do you feel stress - tense, restless, nervous, or anxious, or unable to sleep at night because your mind is troubled all the time - these days?: Only a little  Social Connections: Socially Integrated (01/03/2024)   Received from [facility]   Social Network    How would you rate your social network (family, work, friends)?: Good participation with social networks  Intimate Partner Violence: Not At Risk (01/03/2024)   Received from [facility]   HITS    Over the last  12 months how often did your partner physically hurt you?:  Never    Over the last 12 months how often did your partner insult you or talk down to you?: Never    Over the last 12 months how often did your partner threaten you with physical harm?: Never    Over the last 12 months how often did your partner scream or curse at you?: Never    Physical Exam: There were no vitals filed for this visit. There is no height or weight on file to calculate BMI. GEN: NAD EYE: Sclerae anicteric ENT: MMM CV: Non-tachycardic GI: Soft, NT/ND NEURO:  Alert & Oriented x 3  Lab Results: No results for input(s): WBC, HGB, HCT, PLT in the last 72 hours. BMET No results for input(s): NA, K, CL, CO2, GLUCOSE, BUN, CREATININE, CALCIUM  in the last 72 hours. LFT No results for input(s): PROT, ALBUMIN, AST, ALT, ALKPHOS, BILITOT, BILIDIR, IBILI in the last 72 hours. PT/INR No results for input(s): LABPROT, INR in the last 72 hours.   Impression / Plan: This is a 58 y.o.female who presents for ERCP for attempt at removal of choledocholithiasis noted on recent EUS.  The risks of an ERCP were discussed at length, including but not limited to the risk of perforation, bleeding, abdominal pain, post-ERCP pancreatitis (while usually mild can be severe and even life threatening).   The risks and benefits of endoscopic evaluation/treatment were discussed with the patient and/or family; these include but are not limited to the risk of perforation, infection, bleeding, missed lesions, lack of diagnosis, severe illness requiring hospitalization, as well as anesthesia and sedation related illnesses.  The patient's history has been reviewed, patient examined, no change in status, and deemed stable for procedure.  The patient and/or family is agreeable to proceed.    Aloha Finner, MD [facility] Office # [phone]

## 2024-03-02 NOTE — Op Note (Signed)
 [facility] Patient Name: Holly Mcdaniel Procedure Date: 03/02/2024 MRN: [id number] Attending MD: Aloha Finner , MD, [id number] Date of Birth: 11-29-1965 CSN: [id number] Age: 58 Admit Type: Outpatient Procedure:                ERCP Indications:              Common bile duct stone(s), Abnormal endoscopic                            ultrasound of the biliary system Providers:                Aloha Finner, MD, Hoy Penner, RN, Farris Southgate, Technician Referring MD:              Medicines:                General Anesthesia, Cipro 400 mg IV, Diclofenac 100                            mg rectal, Glucagon 1.5 mg IV Complications:            No immediate complications. Estimated Blood Loss:     Estimated blood loss was minimal. Procedure:                Pre-Anesthesia Assessment:                           - Prior to the procedure, a History and Physical                            was performed, and patient medications and                            allergies were reviewed. The patient's tolerance of                            previous anesthesia was also reviewed. The risks                            and benefits of the procedure and the sedation                            options and risks were discussed with the patient.                            All questions were answered, and informed consent                            was obtained. Prior Anticoagulants: The patient has                            taken no anticoagulant or antiplatelet agents. ASA  Grade Assessment: III - A patient with severe                            systemic disease. After reviewing the risks and                            benefits, the patient was deemed in satisfactory                            condition to undergo the procedure.                           After obtaining informed consent, the scope was                            passed  under direct vision. Throughout the                            procedure, the patient's blood pressure, pulse, and                            oxygen saturations were monitored continuously. The                            [employer] D single use                            duodenoscope was introduced through the mouth, and                            used to inject contrast into and used to inject                            contrast into the ventral pancreatic duct. The ERCP                            was extremely difficult due to challenging                            cannulation. Successful completion of the procedure                            was aided by changing the patient's position and                            performing the maneuvers documented (below) in this                            report. Scope In: Scope Out: Findings:      A scout film of the abdomen was obtained. Surgical clips, consistent       with a previous cholecystectomy, were seen in the area of the right       upper quadrant of the abdomen.      The  upper GI tract was traversed under direct vision without detailed       examination. No gross lesions were noted in the distal esophagus and at       the gastroesophageal junction (previous esophagitis is healed). No gross       lesions were noted in the entire examined stomach. No gross lesions were       noted in the duodenal bulb, in the first portion of the duodenum and in       the second portion of the duodenum. The major papilla was congested and       angulated laterally.      The bile duct could not be cannulated with the short-nosed traction       Hydra sphincterotome and revolution Jagtome. This patient's anatomy did       not allow long position. Semilong position would occasionally allow an       opportunity for an enfos view of the papilla. After extensive attempts       in normal ERCP positions, and inability to access the bile duct,        decision was made to move forward with precut fistulotomy. A 10 mm in       length precut fistulotomy was made with a monofilament needle knife       using a freehand technique using ERBE electrocautery and this was       extended downwards towards the sphincter. There was no post-fistulotomy       bleeding. We could see a bit of bile leaking from the orifice, but could       not find a specific area. The bile duct could not be cannulated with the       short-nosed traction sphincterotome and revolution Jagtome       sphincterotome. Repeated attempts at biliary cannulation were not       successful while using a wire-guided approach. This eventually led to       placement of the 0.025 revolution Jagwire within the presumed pancreatic       duct. Decision was made to pursue attempt at a double-wire approach. The       wire was left within the pancreatic duct. The bile duct could not be       cannulated with the short-nosed traction sphincterotome or revolution       Jagtome or needle-knife. Decision made to further attempt biliary       cannulation after placement of a pancreas stent. One 4 Fr by 7 cm       temporary plastic pancreatic stent with a single external pigtail was       placed into the ventral pancreatic duct over the wire that was already       in the pancreas. The stent was in good position. We once again tried to       cannulate the bile duct with the short-nosed traction sphincterotome or       revolution Jagtome. The fistulotomy was further extended another 4 mm in       length with a monofilament needle knife over the already placed       pancreatic stent using ERBE electrocautery. There was no       post-fistulotomy bleeding. Further attempts in short and 70 long       position with multiple sphincterotome's did not allow for bile duct       cannulation.      A formal  contrast pancreatogram was not performed.      The duodenoscope was withdrawn from the  patient. Impression:               - No gross lesions in the distal esophagus and in                            the gastroesophageal junction. Esophagitis is                            healed.                           - No gross lesions in the entire stomach.                           - No gross lesions in the duodenal bulb, in the                            first portion of the duodenum and in the second                            portion of the duodenum.                           - The major papilla appeared congested and                            laterally rotated.                           - Difficult cannulation as described above.                            Requiring attempt at double wire technique and wire                            over pancreatic stent. Eventually required precut                            fistulotomy with extension to 14 mm. Cannulation of                            the bile duct was not successful today.                           - One temporary plastic pancreatic stent was placed                            into the ventral pancreatic duct. Moderate Sedation:      Not Applicable - Patient had care per Anesthesia. Recommendation:           - The patient will be observed post-procedure,                            until all discharge  criteria are met.                           - Discharge patient to home.                           - Patient has a contact number available for                            emergencies. The signs and symptoms of potential                            delayed complications were discussed with the                            patient. Return to normal activities tomorrow.                            Written discharge instructions were provided to the                            patient.                           - Low fat diet.                           - Watch for pancreatitis, bleeding, perforation,                            and  cholangitis.                           - Observe patient's clinical course.                           - Will discuss with patient repeat ERCP attempt                            with me in the next few weeks for fistula formation                            from the precut vs referral to [facility]                            for repeat attempt at biliary cannulation and stone                            extraction.                           - Patient will need a KUB 2-view in 10-14 days to                            ensure pancreatic stent has migrated successfully.  If still present at that time will need to be                            scheduled for EGD with stent pull.                           - The findings and recommendations were discussed                            with the patient.                           - The findings and recommendations were discussed                            with the patient's family. Procedure Code(s):        --- Professional ---                           [phone], Endoscopic retrograde                            cholangiopancreatography (ERCP); with placement of                            endoscopic stent into biliary or pancreatic duct,                            including pre- and post-dilation and guide wire                            passage, when performed, including sphincterotomy,                            when performed, each stent                           [id number], 59, Endoscopic retrograde                            cholangiopancreatography (ERCP); with                            sphincterotomy/papillotomy Diagnosis Code(s):        --- Professional ---                           K80.50, Calculus of bile duct without cholangitis                            or cholecystitis without obstruction                           K83.8, Other specified diseases of biliary tract                           R93.2, Abnormal findings  on  diagnostic imaging of                            liver and biliary tract CPT copyright 2022 American Medical Association. All rights reserved. The codes documented in this report are preliminary and upon coder review may  be revised to meet current compliance requirements. Aloha Finner, MD 03/02/2024 1:16:04 PM Number of Addenda: 0

## 2024-03-02 NOTE — Anesthesia Postprocedure Evaluation (Signed)
 Anesthesia Post Note  Patient: Holly Mcdaniel  Procedure(s) Performed: ERCP, WITH INTERVENTION IF INDICATED     Patient location during evaluation: PACU Anesthesia Type: General Level of consciousness: awake and alert, oriented and patient cooperative Pain management: pain level controlled Vital Signs Assessment: post-procedure vital signs reviewed and stable Respiratory status: spontaneous breathing, nonlabored ventilation and respiratory function stable Cardiovascular status: blood pressure returned to baseline and stable Postop Assessment: no apparent nausea or vomiting Anesthetic complications: no   No notable events documented.  Last Vitals:  Vitals:   03/02/24 1400 03/02/24 1410  BP: (!) 140/67 137/66  Pulse: 76 70  Resp: 13 10  Temp:    SpO2: 98% 98%    Last Pain:  Vitals:   03/02/24 1410  TempSrc:   PainSc: 0-No pain                 Almarie CHRISTELLA Marchi

## 2024-03-02 NOTE — Anesthesia Preprocedure Evaluation (Addendum)
 Anesthesia Evaluation  Patient identified by MRN, date of birth, ID band Patient awake    Reviewed: Allergy & Precautions, NPO status , Patient's Chart, lab work & pertinent test results  Airway Mallampati: II  TM Distance: >3 FB Neck ROM: Full    Dental  (+) Teeth Intact   Pulmonary asthma (well controlled, no inhalers) , former smoker   Pulmonary exam normal breath sounds clear to auscultation       Cardiovascular hypertension (168/94 preop, per pt normally 120/90 preop), Pt. on medications Normal cardiovascular exam Rhythm:Regular Rate:Normal     Neuro/Psych  PSYCHIATRIC DISORDERS Anxiety Depression    negative neurological ROS     GI/Hepatic Neg liver ROS,GERD  Medicated and Controlled,,Choledocholithiasis    Endo/Other  Hypothyroidism  BMI 38   Renal/GU negative Renal ROS  negative genitourinary   Musculoskeletal  (+) Arthritis , Osteoarthritis,    Abdominal  (+) + obese  Peds  Hematology negative hematology ROS (+)   Anesthesia Other Findings   Reproductive/Obstetrics negative OB ROS                              Anesthesia Physical Anesthesia Plan  ASA: 3  Anesthesia Plan: General   Post-op Pain Management: Tylenol  PO (pre-op)*   Induction: Intravenous  PONV Risk Score and Plan: 3 and Ondansetron , Dexamethasone, Midazolam  and Treatment may vary due to age or medical condition  Airway Management Planned: Oral ETT  Additional Equipment: None  Intra-op Plan:   Post-operative Plan: Extubation in OR  Informed Consent: I have reviewed the patients History and Physical, chart, labs and discussed the procedure including the risks, benefits and alternatives for the proposed anesthesia with the patient or authorized representative who has indicated his/her understanding and acceptance.     Dental advisory given  Plan Discussed with: CRNA  Anesthesia Plan Comments:           Anesthesia Quick Evaluation

## 2024-03-02 NOTE — Transfer of Care (Signed)
 Immediate Anesthesia Transfer of Care Note  Patient: Holly Mcdaniel  Procedure(s) Performed: ERCP, WITH INTERVENTION IF INDICATED  Patient Location: PACU and Endoscopy Unit  Anesthesia Type:General  Level of Consciousness: awake, alert , and oriented  Airway & Oxygen Therapy: Patient Spontanous Breathing and Patient connected to nasal cannula oxygen  Post-op Assessment: Report given to RN and Post -op Vital signs reviewed and stable  Post vital signs: Reviewed and stable  Last Vitals:  Vitals Value Taken Time  BP 142/69 03/02/24 13:10  Temp 36.6 C 03/02/24 13:10  Pulse 79 03/02/24 13:12  Resp 16 03/02/24 13:12  SpO2 97 % 03/02/24 13:12  Vitals shown include unfiled device data.  Last Pain:  Vitals:   03/02/24 1310  TempSrc: Temporal  PainSc: 0-No pain         Complications: No notable events documented.

## 2024-03-02 NOTE — Telephone Encounter (Signed)
 ERCP has been set up and pt aware Kub entered

## 2024-03-02 NOTE — Anesthesia Procedure Notes (Signed)
 Procedure Name: Intubation Date/Time: 03/02/2024 11:16 AM  Performed by: Obadiah Reyes BROCKS, CRNAPre-anesthesia Checklist: Patient identified, Emergency Drugs available, Suction available and Patient being monitored Patient Re-evaluated:Patient Re-evaluated prior to induction Oxygen Delivery Method: Circle System Utilized Preoxygenation: Pre-oxygenation with 100% oxygen Induction Type: IV induction Ventilation: Mask ventilation without difficulty Laryngoscope Size: Miller and 2 Grade View: Grade I Tube type: Oral Number of attempts: 1 Airway Equipment and Method: Stylet and Oral airway Placement Confirmation: ETT inserted through vocal cords under direct vision, positive ETCO2 and breath sounds checked- equal and bilateral Secured at: 21 cm Tube secured with: Tape Dental Injury: Teeth and Oropharynx as per pre-operative assessment

## 2024-03-03 ENCOUNTER — Encounter (HOSPITAL_COMMUNITY): Payer: Self-pay | Admitting: Gastroenterology

## 2024-03-07 ENCOUNTER — Encounter: Payer: Self-pay | Admitting: Gastroenterology

## 2024-03-08 ENCOUNTER — Encounter: Payer: Self-pay | Admitting: Gastroenterology

## 2024-03-08 NOTE — Telephone Encounter (Signed)
 Spoke with him and he is mentioning that patient is struggling with ozempic /wegovy . Do you know if we have received her past records yet? If not can we try again to get them.

## 2024-03-10 NOTE — Telephone Encounter (Signed)
 Left voicemail for patient. Will see if she calls back. Asked her to write up/type of her questions on MyChart and we can try to answer them next week. GM

## 2024-03-10 NOTE — Telephone Encounter (Signed)
 Dr Wilhelmenia the pt would like you to call her to discuss some questions before she is set up for ERCP (location) she will have KUB next week.   Please call [phone]

## 2024-03-12 ENCOUNTER — Encounter: Payer: Self-pay | Admitting: Internal Medicine

## 2024-03-12 NOTE — Telephone Encounter (Signed)
 I was able to reach the patient on Friday afternoon after she called back. Went over her questions and concerns. She has agreed to move forward with repeat ERCP attempt with me to try to gain access to the biliary tree after recent failed ERCP. I had offered her referral to one of the quaternary centers for further evaluation or attempt at access. She is also aware that should we try again here in [place] and be unsuccessful, that she will require another ERCP definitely at a quaternary center. She has agreed to move forward with repeat attempt here with me.  I will forward this to my team. She does have a new granddaughter on the way coming in mid November. She would like to try to schedule the ERCP before her granddaughter's birth if possible. I do not know if I will have availability before then. I will have my team reach out to her next week. They will discuss when it is available for next ERCP attempt with me. If this is beyond the date of her expected  granddaughter's birth, she may elect to have ERCP done at [facility] center. If she elects this, place wherever she would like in regards to [facility] or [facility] or [facility] as urgent colonoscopy for choledocholithiasis. I will wait next week to see what she has chosen to do.  Aloha Finner, MD [facility] Office # [phone]

## 2024-03-13 MED ORDER — LEVOTHYROXINE SODIUM 50 MCG PO TABS: 50.0000 ug | ORAL_TABLET | Freq: Every day | ORAL | 1 refills | Status: DC

## 2024-03-13 NOTE — Addendum Note (Signed)
 Addended by: ROLLENE NORRIS A on: 03/13/2024 11:33 AM   Modules accepted: Orders

## 2024-03-13 NOTE — Telephone Encounter (Signed)
 Looks like levothyroxine will be 1st fill w you, ok to refill?  I see Dr. Porfirio has reached out to you, we are just waiting to see if we have records per your note 10/8

## 2024-03-14 NOTE — Telephone Encounter (Signed)
 Im going to request it electronically through epic since I no longer have her form are we requesting this information from Dr Porfirio office?

## 2024-03-15 ENCOUNTER — Ambulatory Visit (INDEPENDENT_AMBULATORY_CARE_PROVIDER_SITE_OTHER)
Admission: RE | Admit: 2024-03-15 | Discharge: 2024-03-15 | Disposition: A | Discharge status: Home / Self Care | Source: Ambulatory Visit | Attending: Gastroenterology | Admitting: Gastroenterology

## 2024-03-15 DIAGNOSIS — Z9689 Presence of other specified functional implants: Secondary | ICD-10-CM

## 2024-03-15 DIAGNOSIS — T85528A Displacement of other gastrointestinal prosthetic devices, implants and grafts, initial encounter: Secondary | ICD-10-CM

## 2024-03-19 DIAGNOSIS — S32010A Wedge compression fracture of first lumbar vertebra, initial encounter for closed fracture: Secondary | ICD-10-CM | POA: Diagnosis not present

## 2024-03-19 DIAGNOSIS — M546 Pain in thoracic spine: Secondary | ICD-10-CM | POA: Diagnosis not present

## 2024-03-20 ENCOUNTER — Ambulatory Visit: Payer: Self-pay | Admitting: Gastroenterology

## 2024-03-20 DIAGNOSIS — S22050A Wedge compression fracture of T5-T6 vertebra, initial encounter for closed fracture: Secondary | ICD-10-CM | POA: Diagnosis not present

## 2024-03-21 ENCOUNTER — Encounter: Payer: Self-pay | Admitting: Emergency Medicine

## 2024-03-21 ENCOUNTER — Other Ambulatory Visit: Payer: Self-pay | Admitting: Emergency Medicine

## 2024-03-21 DIAGNOSIS — M546 Pain in thoracic spine: Secondary | ICD-10-CM

## 2024-03-22 ENCOUNTER — Inpatient Hospital Stay
Admission: RE | Admit: 2024-03-22 | Discharge: 2024-03-22 | Attending: Emergency Medicine | Admitting: Emergency Medicine

## 2024-03-22 DIAGNOSIS — S22058A Other fracture of T5-T6 vertebra, initial encounter for closed fracture: Secondary | ICD-10-CM | POA: Diagnosis not present

## 2024-03-22 DIAGNOSIS — M546 Pain in thoracic spine: Secondary | ICD-10-CM

## 2024-03-23 ENCOUNTER — Encounter: Payer: Self-pay | Admitting: Internal Medicine

## 2024-03-23 NOTE — Telephone Encounter (Signed)
 I have requested records electronically

## 2024-03-23 NOTE — Progress Notes (Signed)
  error

## 2024-03-24 DIAGNOSIS — S22050A Wedge compression fracture of T5-T6 vertebra, initial encounter for closed fracture: Secondary | ICD-10-CM | POA: Diagnosis not present

## 2024-03-28 DIAGNOSIS — F4310 Post-traumatic stress disorder, unspecified: Secondary | ICD-10-CM | POA: Diagnosis not present

## 2024-04-03 ENCOUNTER — Other Ambulatory Visit: Payer: Self-pay

## 2024-04-03 DIAGNOSIS — K8050 Calculus of bile duct without cholangitis or cholecystitis without obstruction: Secondary | ICD-10-CM

## 2024-04-07 DIAGNOSIS — S22050A Wedge compression fracture of T5-T6 vertebra, initial encounter for closed fracture: Secondary | ICD-10-CM | POA: Diagnosis not present

## 2024-04-20 DIAGNOSIS — F4310 Post-traumatic stress disorder, unspecified: Secondary | ICD-10-CM | POA: Diagnosis not present

## 2024-04-29 NOTE — Telephone Encounter (Signed)
 This should not be a problem. We can place our preprocedure endoscopy nursing team to reach out and discussed with anesthesia to make sure no other issues. Thanks. CAFFIE Sailors, Can you just check with anesthesia that there are no issues with regards to her ERCP and need for manipulation/sedation/moving during her ERCP procedure? Thanks. GM

## 2024-05-01 NOTE — Telephone Encounter (Signed)
 Thanks for update. GM

## 2024-05-10 ENCOUNTER — Telehealth: Payer: Self-pay | Admitting: Gastroenterology

## 2024-05-10 ENCOUNTER — Other Ambulatory Visit: Payer: Self-pay | Admitting: Internal Medicine

## 2024-05-10 DIAGNOSIS — S22050A Wedge compression fracture of T5-T6 vertebra, initial encounter for closed fracture: Secondary | ICD-10-CM | POA: Diagnosis not present

## 2024-05-10 NOTE — Telephone Encounter (Addendum)
 Procedure:ERCP Procedure date: 05/18/24 Procedure location: [facility] Arrival Time: 6:30 am Spoke with the patient Y/N: Yes Any prep concerns? No  Has the patient obtained the prep from the pharmacy ? No prep needed Do you have a care partner and transportation: Yes Any additional concerns? No

## 2024-05-11 ENCOUNTER — Encounter (HOSPITAL_COMMUNITY): Payer: Self-pay | Admitting: Gastroenterology

## 2024-05-11 NOTE — Progress Notes (Signed)
 Attempted to obtain medical history for pre op call via telephone, unable to reach at this time. HIPAA compliant voicemail message left requesting return call to pre surgical testing department.

## 2024-05-15 NOTE — Telephone Encounter (Signed)
 Spoke with pt. Notified of the med refill from pharmacy however pt has not been seen in office and needs a OV and possible labs. Pt stated that she was at the hospital due to her daughter having a baby. Pt also stated that she may have less than 2 weeks on meds. Advised pt to call office to schedule OV prior to running out. Once OV is on the books, we can send in enough to get her to her appt. Pt verbalized understanding and will call office back to schedule.

## 2024-05-18 ENCOUNTER — Encounter (HOSPITAL_COMMUNITY): Payer: Self-pay | Admitting: Gastroenterology

## 2024-05-18 ENCOUNTER — Encounter (HOSPITAL_COMMUNITY): Admission: RE | Disposition: A | Payer: Self-pay | Attending: Gastroenterology

## 2024-05-18 ENCOUNTER — Ambulatory Visit (HOSPITAL_COMMUNITY)
Admission: RE | Admit: 2024-05-18 | Discharge: 2024-05-18 | Disposition: A | Discharge status: Home / Self Care | Attending: Gastroenterology | Admitting: Gastroenterology

## 2024-05-18 ENCOUNTER — Other Ambulatory Visit: Payer: Self-pay

## 2024-05-18 ENCOUNTER — Ambulatory Visit (HOSPITAL_COMMUNITY): Admitting: Anesthesiology

## 2024-05-18 ENCOUNTER — Ambulatory Visit (HOSPITAL_COMMUNITY)

## 2024-05-18 DIAGNOSIS — K838 Other specified diseases of biliary tract: Secondary | ICD-10-CM

## 2024-05-18 DIAGNOSIS — Z9049 Acquired absence of other specified parts of digestive tract: Secondary | ICD-10-CM | POA: Insufficient documentation

## 2024-05-18 DIAGNOSIS — Z9889 Other specified postprocedural states: Secondary | ICD-10-CM

## 2024-05-18 DIAGNOSIS — K8040 Calculus of bile duct with cholecystitis, unspecified, without obstruction: Secondary | ICD-10-CM | POA: Insufficient documentation

## 2024-05-18 DIAGNOSIS — K8050 Calculus of bile duct without cholangitis or cholecystitis without obstruction: Secondary | ICD-10-CM | POA: Diagnosis not present

## 2024-05-18 HISTORY — PX: ERCP: SHX5425

## 2024-05-18 HISTORY — PX: STONE EXTRACTION WITH BASKET: SHX5318

## 2024-05-18 HISTORY — PX: SPHINCTEROTOMY: SHX5279

## 2024-05-18 SURGERY — ERCP, WITH INTERVENTION IF INDICATED
Anesthesia: General

## 2024-05-18 MED ORDER — ONDANSETRON HCL 4 MG/2ML IJ SOLN
INTRAMUSCULAR | Status: DC | PRN
  Administered 2024-05-18: 08:00:00 4 mg via INTRAVENOUS

## 2024-05-18 MED ORDER — PROPOFOL 10 MG/ML IV BOLUS
INTRAVENOUS | Status: DC | PRN
  Administered 2024-05-18: 08:00:00 160 mg via INTRAVENOUS

## 2024-05-18 MED ORDER — SODIUM CHLORIDE 0.9 % IV SOLN
INTRAVENOUS | Status: DC | PRN
  Administered 2024-05-18: 09:00:00 20 mL

## 2024-05-18 MED ORDER — SODIUM CHLORIDE 0.9 % IV SOLN: INTRAVENOUS | Status: DC

## 2024-05-18 MED ORDER — LIDOCAINE 2% (20 MG/ML) 5 ML SYRINGE
INTRAMUSCULAR | Status: DC | PRN
  Administered 2024-05-18: 08:00:00 60 mg via INTRAVENOUS

## 2024-05-18 MED ORDER — PHENYLEPHRINE 80 MCG/ML (10ML) SYRINGE FOR IV PUSH (FOR BLOOD PRESSURE SUPPORT)
PREFILLED_SYRINGE | INTRAVENOUS | Status: DC | PRN
  Administered 2024-05-18 (×2): 160 ug via INTRAVENOUS

## 2024-05-18 MED ORDER — MIDAZOLAM HCL 2 MG/2ML IJ SOLN
INTRAMUSCULAR | Status: AC
  Filled 2024-05-18: qty 2

## 2024-05-18 MED ORDER — CIPROFLOXACIN IN D5W 400 MG/200ML IV SOLN
INTRAVENOUS | Status: DC | PRN
  Administered 2024-05-18: 08:00:00 400 mg via INTRAVENOUS

## 2024-05-18 MED ORDER — PROPOFOL 10 MG/ML IV BOLUS
INTRAVENOUS | Status: AC
  Filled 2024-05-18: qty 20

## 2024-05-18 MED ORDER — FENTANYL CITRATE (PF) 100 MCG/2ML IJ SOLN
INTRAMUSCULAR | Status: DC | PRN
  Administered 2024-05-18: 08:00:00 100 ug via INTRAVENOUS

## 2024-05-18 MED ORDER — MIDAZOLAM HCL 5 MG/5ML IJ SOLN
INTRAMUSCULAR | Status: DC | PRN
  Administered 2024-05-18: 08:00:00 2 mg via INTRAVENOUS

## 2024-05-18 MED ORDER — DICLOFENAC SUPPOSITORY 100 MG
RECTAL | Status: DC | PRN
  Administered 2024-05-18: 08:00:00 100 mg via RECTAL

## 2024-05-18 MED ORDER — DEXAMETHASONE SOD PHOSPHATE PF 10 MG/ML IJ SOLN
INTRAMUSCULAR | Status: DC | PRN
  Administered 2024-05-18: 08:00:00 10 mg via INTRAVENOUS

## 2024-05-18 MED ORDER — ROCURONIUM BROMIDE 10 MG/ML (PF) SYRINGE
PREFILLED_SYRINGE | INTRAVENOUS | Status: DC | PRN
  Administered 2024-05-18: 08:00:00 60 mg via INTRAVENOUS

## 2024-05-18 MED ORDER — GLUCAGON HCL RDNA (DIAGNOSTIC) 1 MG IJ SOLR
INTRAMUSCULAR | Status: DC | PRN
  Administered 2024-05-18 (×2): .25 mg via INTRAVENOUS

## 2024-05-18 MED ORDER — SUGAMMADEX SODIUM 200 MG/2ML IV SOLN
INTRAVENOUS | Status: DC | PRN
  Administered 2024-05-18: 09:00:00 200 mg via INTRAVENOUS

## 2024-05-18 MED ORDER — LACTATED RINGERS IV SOLN
INTRAVENOUS | Status: AC | PRN
  Administered 2024-05-18: 07:00:00 1000 mL via INTRAVENOUS

## 2024-05-18 MED ORDER — FENTANYL CITRATE (PF) 100 MCG/2ML IJ SOLN
INTRAMUSCULAR | Status: AC
  Filled 2024-05-18: qty 2

## 2024-05-18 MED FILL — Ciprofloxacin 400 MG/200ML in D5W: INTRAVENOUS | Qty: 200 | Status: AC

## 2024-05-18 MED FILL — Diclofenac Potassium Tab 50 MG: RECTAL | Qty: 1 | Status: AC

## 2024-05-18 MED FILL — Glucagon HCl (rDNA) Diagnostic For Inj 1 MG (Base Equiv): INTRAMUSCULAR | Qty: 1 | Status: AC

## 2024-05-18 NOTE — Transfer of Care (Signed)
 Immediate Anesthesia Transfer of Care Note  Patient: Holly Mcdaniel  Procedure(s) Performed: ERCP, WITH INTERVENTION IF INDICATED SPHINCTEROTOMY ERCP, WITH LITHROTRIPSY OR REMOVAL OF COMMON BILE DUCT CALCULUS USING BASKET  Patient Location: PACU  Anesthesia Type:General  Level of Consciousness: sedated, patient cooperative, and responds to stimulation  Airway & Oxygen Therapy: Patient Spontanous Breathing and Patient connected to face mask oxygen  Post-op Assessment: Report given to RN and Post -op Vital signs reviewed and stable  Post vital signs: Reviewed and stable  Last Vitals:  Vitals Value Taken Time  BP 131/59 05/18/24 08:50  Temp    Pulse 71 05/18/24 08:50  Resp 19 05/18/24 08:50  SpO2 99 % 05/18/24 08:50  Vitals shown include unfiled device data.  Last Pain:  Vitals:   05/18/24 0700  TempSrc: Oral  PainSc: 0-No pain         Complications: No notable events documented.

## 2024-05-18 NOTE — Discharge Instructions (Signed)

## 2024-05-18 NOTE — Anesthesia Preprocedure Evaluation (Addendum)
 Anesthesia Evaluation  Patient identified by MRN, date of birth, ID band Patient awake    Reviewed: Allergy & Precautions, NPO status , Patient's Chart, lab work & pertinent test results  Airway Mallampati: II  TM Distance: >3 FB Neck ROM: Full    Dental  (+) Teeth Intact   Pulmonary asthma (well controlled, no inhalers) , former smoker   Pulmonary exam normal breath sounds clear to auscultation       Cardiovascular hypertension (168/94 preop, per pt normally 120/90 preop), Pt. on medications Normal cardiovascular exam Rhythm:Regular Rate:Normal     Neuro/Psych  PSYCHIATRIC DISORDERS Anxiety Depression    negative neurological ROS     GI/Hepatic Neg liver ROS,GERD  Medicated and Controlled,,Choledocholithiasis    Endo/Other  Hypothyroidism  BMI 38   Renal/GU negative Renal ROS  negative genitourinary   Musculoskeletal  (+) Arthritis , Osteoarthritis,    Abdominal  (+) + obese  Peds  Hematology negative hematology ROS (+)   Anesthesia Other Findings   Reproductive/Obstetrics negative OB ROS                              Anesthesia Physical Anesthesia Plan  ASA: 3  Anesthesia Plan: General   Post-op Pain Management: Tylenol  PO (pre-op)*   Induction: Intravenous  PONV Risk Score and Plan: 3 and Ondansetron , Dexamethasone, Midazolam  and Treatment may vary due to age or medical condition  Airway Management Planned: Oral ETT  Additional Equipment: None  Intra-op Plan:   Post-operative Plan: Extubation in OR  Informed Consent: I have reviewed the patients History and Physical, chart, labs and discussed the procedure including the risks, benefits and alternatives for the proposed anesthesia with the patient or authorized representative who has indicated his/her understanding and acceptance.     Dental advisory given  Plan Discussed with: CRNA  Anesthesia Plan Comments:           Anesthesia Quick Evaluation

## 2024-05-18 NOTE — Op Note (Signed)
 [facility] Patient Name: Holly Mcdaniel Procedure Date: 05/18/2024 MRN: [id number] Attending MD: Aloha Finner , MD, [id number] Date of Birth: 07-Nov-1965 CSN: [id number] Age: 58 Admit Type: Outpatient Procedure:                ERCP Indications:              Bile duct stone(s), Prior failed Endoscopic                            Retrograde Cholangiopancreatography Providers:                Aloha Finner, MD, Clotilda Schmitz, RN, Fairy [place], Technician Referring MD:              Medicines:                General Anesthesia, Cipro  400 mg IV, Diclofenac  100                            mg rectal, Glucagon  0.5 mg IV Complications:            No immediate complications. Estimated Blood Loss:     Estimated blood loss was minimal. Procedure:                Pre-Anesthesia Assessment:                           - Prior to the procedure, a History and Physical                            was performed, and patient medications and                            allergies were reviewed. The patient's tolerance of                            previous anesthesia was also reviewed. The risks                            and benefits of the procedure and the sedation                            options and risks were discussed with the patient.                            All questions were answered, and informed consent                            was obtained. Prior Anticoagulants: The patient has                            taken no anticoagulant or antiplatelet agents. ASA  Grade Assessment: III - A patient with severe                            systemic disease. After reviewing the risks and                            benefits, the patient was deemed in satisfactory                            condition to undergo the procedure.                           After obtaining informed consent, the scope was                             passed under direct vision. Throughout the                            procedure, the patient's blood pressure, pulse, and                            oxygen saturations were monitored continuously. The                            [employer] D single use                            duodenoscope was introduced through the mouth, and                            used to inject contrast into and used to inject                            contrast into the bile duct. The ERCP was                            accomplished without difficulty. The patient                            tolerated the procedure. Scope In: Scope Out: Findings:      A scout film of the abdomen was obtained. Surgical clips, consistent       with a previous cholecystectomy, were seen in the area of the right       upper quadrant of the abdomen.      The esophagus was successfully intubated under direct vision without       detailed examination of the pharynx, larynx, and associated structures,       and upper GI tract. A major papilla precut sphincterotomy had been       performed which and the major papilla was still noted to be laterally       displaced. Small amount of bile emergin from precut site.      A 0.035 inch x 260 cm straight Hydra Jagwire was passed into the biliary       tree. The Hydratome  sphincterotome was passed over the guidewire and the       bile duct was then deeply cannulated. Contrast was injected. I       personally interpreted the bile duct images. Ductal flow of contrast was       adequate. Image quality was adequate. Contrast extended to the hepatic       ducts. Opacification of the entire biliary tree except for the cystic       duct and gallbladder was successful. The lower third of the main bile       duct contained filling defect thought to be a stone and sludge. The main       bile duct was mildly dilated. The largest diameter was 14 mm. An 8 mm       biliary sphincterotomy was  made with a monofilament Hydratome       sphincterotome using ERBE electrocautery. There was no       post-sphincterotomy bleeding. To discover objects, the biliary tree was       swept with a retrieval balloon. Sludge was swept from the duct. One       stone was removed. No stones remained. An occlusion cholangiogram was       performed that showed no further significant biliary pathology.      A pancreatogram was not performed.      The duodenoscope was withdrawn from the patient. 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                            Small bile noted from the region.                           - A filling defect consistent with a stone and                            sludge was seen on the cholangiogram.                           - The entire main bile duct was mildly dilated.                           - Choledocholithiasis was found. Complete removal                            was accomplished by biliary sphincterotomy and                            balloon sweep. Moderate Sedation:      Not Applicable - Patient had care per Anesthesia. Recommendation:           - The patient will be observed post-procedure,                            until all discharge criteria are met.                           - Discharge patient to home.                           -  Patient has a contact number available for                            emergencies. The signs and symptoms of potential                            delayed complications were discussed with the                            patient. Return to normal activities tomorrow.                            Written discharge instructions were provided to the                            patient.                           - Low fat diet.                           - Watch for pancreatitis, bleeding, perforation,                            and cholangitis.                           - Observe patient's clinical  course.                           - Check liver enzymes (AST, ALT, alkaline                            phosphatase, bilirubin) in 2 weeks.                           - The findings and recommendations were discussed                            with the patient.                           - The findings and recommendations were discussed                            with the patient's family. Procedure Code(s):        --- Professional ---                           [phone], Endoscopic retrograde                            cholangiopancreatography (ERCP); with removal of                            calculi/debris from biliary/pancreatic duct(s)  56737, Endoscopic retrograde                            cholangiopancreatography (ERCP); with                            sphincterotomy/papillotomy                           [id number], 26, Endoscopic catheterization of the                            biliary ductal system, radiological supervision and                            interpretation Diagnosis Code(s):        --- Professional ---                           K80.50, Calculus of bile duct without cholangitis                            or cholecystitis without obstruction                           Z98.890, Other specified postprocedural states                           K83.8, Other specified diseases of biliary tract                           R93.2, Abnormal findings on diagnostic imaging of                            liver and biliary tract CPT copyright 2022 [employer]. All rights reserved. The codes documented in this report are preliminary and upon coder review may  be revised to meet current compliance requirements. Aloha Finner, MD 05/18/2024 8:42:57 AM Number of Addenda: 0

## 2024-05-18 NOTE — H&P (Signed)
 GASTROENTEROLOGY PROCEDURE H&P NOTE   Primary Care Physician: Rollene Almarie LABOR, MD  HPI: Holly Mcdaniel is a 58 y.o. female who presents for ERCP for further attempt at CDL removal after previously failed cannulation.  Past Medical History:  Diagnosis Date   Allergy    Anxiety    Arthritis 2021   ATM gene mutation positive    Breast mass    Childhood asthma    no problems as an adult   Chronic kidney disease    Depression    Dyspareunia    Hx of adenomatous colonic polyps    Hypercholesteremia    Thyroid disease    Vasculitis    Past Surgical History:  Procedure Laterality Date   ABDOMINAL HYSTERECTOMY  2004   TVH--still has ovaries   BLADDER SUSPENSION     breast biopsy Left 12/2014   fibroma   BREAST BIOPSY Left 2016   benign   BREAST SURGERY  12/2014   Benign Lt.breast bx   CARPAL TUNNEL RELEASE Bilateral 2017   CHOLECYSTECTOMY  2015   COLONOSCOPY  02/11/2015   [facility] - hx polyps   ERCP N/A 03/02/2024   Procedure: ERCP, WITH INTERVENTION IF INDICATED;  Surgeon: Wilhelmenia Aloha Raddle., MD;  Location: [facility];  Service: Gastroenterology;  Laterality: N/A;   ESOPHAGOGASTRODUODENOSCOPY N/A 12/22/2023   Procedure: EGD (ESOPHAGOGASTRODUODENOSCOPY);  Surgeon: Wilhelmenia Aloha Raddle., MD;  Location: [facility];  Service: Gastroenterology;  Laterality: N/A;   EUS N/A 12/22/2023   Procedure: ULTRASOUND, UPPER GI TRACT, ENDOSCOPIC;  Surgeon: Wilhelmenia Aloha Raddle., MD;  Location: [facility];  Service: Gastroenterology;  Laterality: N/A;   EYE SURGERY  2022   FOOT FRACTURE SURGERY Right 11/2013   MOUTH SURGERY     gum graft x 4   REFRACTIVE SURGERY     Current Facility-Administered Medications  Medication Dose Route Frequency Provider Last Rate Last Admin   0.9 %  sodium chloride  infusion   Intravenous Continuous Mansouraty, Aloha Raddle., MD       lactated ringers  infusion    Continuous PRN Mansouraty, Aloha Raddle., MD   1,000 mL at 05/18/24 9287    Current Medications[1] Allergies[2] Family History  Problem Relation Age of Onset   Diabetes Mother        borderline   Hypertension Mother    Thyroid disease Mother    Dementia Mother    Arthritis Mother    Depression Mother    Hearing loss Mother    Heart disease Mother    Kidney disease Mother    Pancreatic cancer Father 33       d. 78   Cancer Father    Colon polyps Sister 78       precancer; unknown #; pat half sister   Heart failure Brother    Hypertension Brother    Heart disease Brother    Kidney disease Brother    Heart failure Brother    Asthma Brother    Lung cancer Maternal Aunt 45   Kidney cancer Maternal Uncle 45   Colon cancer Maternal Uncle 65   Prostate cancer Maternal Uncle 60   Colon cancer Maternal Uncle 69   Breast cancer Paternal Aunt 76       lumpectomy   Melanoma Paternal Aunt 55       arm   Breast cancer Cousin        mat female cousin; dx 64s   Bone cancer Cousin 66       mat female  cousin   Skin cancer Cousin 26       mat female cousin; unknown type   Cancer Other        PGM's sister; unk type; d. 54s   Kidney disease Brother    Liver cancer Neg Hx    Esophageal cancer Neg Hx    Social History   Socioeconomic History   Marital status: Married    Spouse name: Not on file   Number of children: 3   Years of education: Not on file   Highest education level: Associate degree: academic program  Occupational History   Occupation: city solicitor  Tobacco Use   Smoking status: Former    Current packs/day: 0.00    Average packs/day: 0.3 packs/day for 10.0 years (2.5 ttl pk-yrs)    Types: Cigarettes    Start date: 06/01/1989    Quit date: 06/01/2009    Years since quitting: 14.9   Smokeless tobacco: Never   Tobacco comments:    socially  Vaping Use   Vaping status: Never Used  Substance and Sexual Activity   Alcohol  use: Yes    Alcohol /week: 1.0 standard drink of alcohol     Types: 1 Glasses of wine per week    Comment: Less than one a  week   Drug use: No   Sexual activity: Yes    Partners: Male    Birth control/protection: Post-menopausal, Surgical    Comment: hysterectomy  Other Topics Concern   Not on file  Social History Narrative   Not on file   Social Drivers of Health   Tobacco Use: Medium Risk (05/18/2024)   Patient History    Smoking Tobacco Use: Former    Smokeless Tobacco Use: Never    Passive Exposure: Not on file  Financial Resource Strain: Low Risk (01/03/2024)   Received from [facility]   Overall Financial Resource Strain (CARDIA)    How hard is it for you to pay for the very basics like food, housing, medical care, and heating?: Not hard at all  Food Insecurity: No Food Insecurity (01/03/2024)   Received from [facility]   Epic    Within the past 12 months, you worried that your food would run out before you got the money to buy more.: Never true    Within the past 12 months, the food you bought just didn't last and you didn't have money to get more.: Never true  Transportation Needs: No Transportation Needs (01/03/2024)   Received from [facility]    In the past 12 months, has lack of transportation kept you from medical appointments or from getting medications?: No    In the past 12 months, has lack of transportation kept you from meetings, work, or from getting things needed for daily living?: No  Physical Activity: Inactive (01/03/2024)   Received from [facility]   Exercise Vital Sign    On average, how many days per week do you engage in moderate to strenuous exercise (like a brisk walk)?: 0 days    Minutes of Exercise per Session: Not on file  Stress: No Stress Concern Present (01/03/2024)   Received from [facility] - Occupational Stress Questionnaire    Do you feel stress - tense, restless, nervous, or anxious, or unable to sleep at night because your mind is troubled all the time - these days?: Only a little  Social Connections:  Socially Integrated (01/03/2024)   Received from [facility]   Social Network    How would you rate your social network (family, work, friends)?: Good participation with social networks  Intimate Partner Violence: Not At Risk (01/03/2024)   Received from [facility]   HITS    Over the last 12 months how often did your partner physically hurt you?: Never    Over the last 12 months how often did your partner insult you or talk down to you?: Never    Over the last 12 months how often did your partner threaten you with physical harm?: Never    Over the last 12 months how often did your partner scream or curse at you?: Never  Depression (PHQ2-9): Low Risk (04/28/2023)   Depression (PHQ2-9)    PHQ-2 Score: 0  Alcohol  Screen: Low Risk (08/09/2023)   Alcohol  Screen    Last Alcohol  Screening Score (AUDIT): 2  Housing: Low Risk (01/03/2024)   Received from [facility]    In the last 12 months, was there a time when you were not able to pay the mortgage or rent on time?: No    In the past 12 months, how many times have you moved where you were living?: 1    At any time in the past 12 months, were you homeless or living in a shelter (including now)?: No  Utilities: Not At Risk (01/03/2024)   Received from [facility]    In the past 12 months has the electric, gas, oil, or water company threatened to shut off services in your home?: No  Health Literacy: Not on file    Physical Exam: Today's Vitals   05/11/24 1249 05/18/24 0700  BP:  135/72  Pulse:  64  Resp:  16  Temp:  97.8 F (36.6 C)  TempSrc:  Oral  SpO2:  97%  Weight: 104 kg 99.8 kg  Height:  5' 6 (1.676 m)  PainSc:  0-No pain   Body mass index is 35.51 kg/m. GEN: NAD EYE: Sclerae anicteric ENT: MMM CV: Non-tachycardic GI: Soft, NT/ND NEURO:  Alert & Oriented x 3  Lab Results: No results for input(s): WBC, HGB, HCT, PLT in the last 72 hours. BMET No results for input(s): NA, K, CL, CO2,  GLUCOSE, BUN, CREATININE, CALCIUM  in the last 72 hours. LFT No results for input(s): PROT, ALBUMIN, AST, ALT, ALKPHOS, BILITOT, BILIDIR, IBILI in the last 72 hours. PT/INR No results for input(s): LABPROT, INR in the last 72 hours.   Impression / Plan: This is a 58 y.o.female who presents for ERCP for further attempt at CDL removal after previously failed cannulation.  The risks of an ERCP were discussed at length, including but not limited to the risk of perforation, bleeding, abdominal pain, post-ERCP pancreatitis (while usually mild can be severe and even life threatening).   The risks and benefits of endoscopic evaluation/treatment were discussed with the patient and/or family; these include but are not limited to the risk of perforation, infection, bleeding, missed lesions, lack of diagnosis, severe illness requiring hospitalization, as well as anesthesia and sedation related illnesses.  The patient's history has been reviewed, patient examined, no change in status, and deemed stable for procedure.  The patient and/or family was provided an opportunity to ask questions and all were answered.  The patient and/or family is agreeable to proceed.    Aloha Finner, MD [facility] Office # [phone]     [1]  Current Facility-Administered Medications:  0.9 %  sodium chloride  infusion, , Intravenous, Continuous, Mansouraty, Aloha Raddle., MD   lactated ringers  infusion, , , Continuous PRN, Mansouraty, Aloha Raddle., MD, 1,000 mL at 05/18/24 0712 [2]  Allergies Allergen Reactions   Covid-19 (Mrna) Vaccine Other (See Comments)    Vasculitis Flare up   Venlafaxine Other (See Comments)    Hot feeling Hot feeling Hot feeling Hot feeling   Erythromycin Hives and Rash

## 2024-05-18 NOTE — Anesthesia Postprocedure Evaluation (Signed)
 Anesthesia Post Note  Patient: Holly Mcdaniel  Procedure(s) Performed: ERCP, WITH INTERVENTION IF INDICATED SPHINCTEROTOMY ERCP, WITH LITHROTRIPSY OR REMOVAL OF COMMON BILE DUCT CALCULUS USING BASKET     Patient location during evaluation: PACU Anesthesia Type: General Level of consciousness: awake and alert Pain management: pain level controlled Vital Signs Assessment: post-procedure vital signs reviewed and stable Respiratory status: spontaneous breathing, nonlabored ventilation, respiratory function stable and patient connected to nasal cannula oxygen Cardiovascular status: blood pressure returned to baseline and stable Postop Assessment: no apparent nausea or vomiting Anesthetic complications: no   No notable events documented.  Last Vitals:  Vitals:   05/18/24 0900 05/18/24 0905  BP: (!) 120/54   Pulse: 62 63  Resp: 15 15  Temp:    SpO2: 100% 95%    Last Pain:  Vitals:   05/18/24 0905  TempSrc:   PainSc: 0-No pain                 Epifanio Lamar BRAVO

## 2024-05-18 NOTE — Anesthesia Procedure Notes (Signed)
 Procedure Name: Intubation Date/Time: 05/18/2024 8:06 AM  Performed by: Cindie Charleen PARAS, CRNAPre-anesthesia Checklist: Patient identified, Emergency Drugs available, Suction available, Patient being monitored and Timeout performed Patient Re-evaluated:Patient Re-evaluated prior to induction Oxygen Delivery Method: Circle system utilized Preoxygenation: Pre-oxygenation with 100% oxygen Induction Type: IV induction Ventilation: Mask ventilation without difficulty Laryngoscope Size: Mac and 4 Grade View: Grade I Tube type: Oral Tube size: 7.0 mm Number of attempts: 1 Airway Equipment and Method: Stylet Placement Confirmation: ETT inserted through vocal cords under direct vision, positive ETCO2 and breath sounds checked- equal and bilateral Secured at: 21 cm Tube secured with: Tape Dental Injury: Teeth and Oropharynx as per pre-operative assessment

## 2024-05-21 ENCOUNTER — Encounter (HOSPITAL_COMMUNITY): Payer: Self-pay | Admitting: Gastroenterology

## 2024-05-29 ENCOUNTER — Encounter: Payer: Self-pay | Admitting: Internal Medicine

## 2024-06-02 ENCOUNTER — Encounter: Payer: Self-pay | Admitting: Internal Medicine

## 2024-06-02 ENCOUNTER — Ambulatory Visit
Admission: RE | Admit: 2024-06-02 | Discharge: 2024-06-02 | Disposition: A | Discharge status: Home / Self Care | Source: Ambulatory Visit | Attending: Internal Medicine | Admitting: Internal Medicine

## 2024-06-02 ENCOUNTER — Other Ambulatory Visit: Payer: Self-pay | Admitting: Internal Medicine

## 2024-06-02 ENCOUNTER — Ambulatory Visit: Admitting: Internal Medicine

## 2024-06-02 VITALS — BP 112/70 | HR 65 | Temp 97.6°F | Ht 66.0 in | Wt 237.0 lb

## 2024-06-02 DIAGNOSIS — M19071 Primary osteoarthritis, right ankle and foot: Secondary | ICD-10-CM | POA: Diagnosis not present

## 2024-06-02 DIAGNOSIS — K8050 Calculus of bile duct without cholangitis or cholecystitis without obstruction: Secondary | ICD-10-CM | POA: Diagnosis not present

## 2024-06-02 DIAGNOSIS — E039 Hypothyroidism, unspecified: Secondary | ICD-10-CM

## 2024-06-02 DIAGNOSIS — M19072 Primary osteoarthritis, left ankle and foot: Secondary | ICD-10-CM

## 2024-06-02 DIAGNOSIS — S22050D Wedge compression fracture of T5-T6 vertebra, subsequent encounter for fracture with routine healing: Secondary | ICD-10-CM | POA: Diagnosis not present

## 2024-06-02 DIAGNOSIS — E559 Vitamin D deficiency, unspecified: Secondary | ICD-10-CM

## 2024-06-02 DIAGNOSIS — E782 Mixed hyperlipidemia: Secondary | ICD-10-CM | POA: Diagnosis not present

## 2024-06-02 DIAGNOSIS — Z0001 Encounter for general adult medical examination with abnormal findings: Secondary | ICD-10-CM

## 2024-06-02 DIAGNOSIS — Z6838 Body mass index (BMI) 38.0-38.9, adult: Secondary | ICD-10-CM

## 2024-06-02 DIAGNOSIS — R7303 Prediabetes: Secondary | ICD-10-CM | POA: Diagnosis not present

## 2024-06-02 DIAGNOSIS — N02B9 Other recurrent and persistent immunoglobulin A nephropathy: Secondary | ICD-10-CM

## 2024-06-02 LAB — LIPID PANEL
Cholesterol: 199 mg/dL (ref 28–200)
HDL: 50.8 mg/dL
LDL Cholesterol: 117 mg/dL — ABNORMAL HIGH (ref 10–99)
NonHDL: 148.66
Total CHOL/HDL Ratio: 4
Triglycerides: 158 mg/dL — ABNORMAL HIGH (ref 10.0–149.0)
VLDL: 31.6 mg/dL (ref 0.0–40.0)

## 2024-06-02 LAB — RENAL FUNCTION PANEL
Albumin: 4.2 g/dL (ref 3.5–5.2)
BUN: 30 mg/dL — ABNORMAL HIGH (ref 6–23)
CO2: 27 meq/L (ref 19–32)
Calcium: 9.6 mg/dL (ref 8.4–10.5)
Chloride: 98 meq/L (ref 96–112)
Creatinine, Ser: 1.53 mg/dL — ABNORMAL HIGH (ref 0.40–1.20)
GFR: 36.74 mL/min — ABNORMAL LOW
Glucose, Bld: 111 mg/dL — ABNORMAL HIGH (ref 70–99)
Phosphorus: 3.7 mg/dL (ref 2.3–4.6)
Potassium: 4.1 meq/L (ref 3.5–5.1)
Sodium: 134 meq/L — ABNORMAL LOW (ref 135–145)

## 2024-06-02 LAB — CBC
HCT: 36.6 % (ref 36.0–46.0)
Hemoglobin: 12.5 g/dL (ref 12.0–15.0)
MCHC: 34.2 g/dL (ref 30.0–36.0)
MCV: 90.5 fl (ref 78.0–100.0)
Platelets: 317 K/uL (ref 150.0–400.0)
RBC: 4.04 Mil/uL (ref 3.87–5.11)
RDW: 12.9 % (ref 11.5–15.5)
WBC: 8.1 K/uL (ref 4.0–10.5)

## 2024-06-02 LAB — HEPATIC FUNCTION PANEL
ALT: 22 U/L (ref 3–35)
AST: 19 U/L (ref 5–37)
Albumin: 4.2 g/dL (ref 3.5–5.2)
Alkaline Phosphatase: 68 U/L (ref 39–117)
Bilirubin, Direct: 0.1 mg/dL (ref 0.1–0.3)
Total Bilirubin: 0.3 mg/dL (ref 0.2–1.2)
Total Protein: 7.3 g/dL (ref 6.0–8.3)

## 2024-06-02 LAB — VITAMIN D 25 HYDROXY (VIT D DEFICIENCY, FRACTURES): VITD: 34.18 ng/mL (ref 30.00–100.00)

## 2024-06-02 LAB — HEMOGLOBIN A1C: Hgb A1c MFr Bld: 6 % (ref 4.6–6.5)

## 2024-06-02 LAB — VITAMIN B12: Vitamin B-12: 345 pg/mL (ref 211–911)

## 2024-06-02 LAB — TSH: TSH: 0.97 u[IU]/mL (ref 0.35–5.50)

## 2024-06-02 MED ORDER — SEMAGLUTIDE-WEIGHT MANAGEMENT 1 MG/0.5ML ~~LOC~~ SOAJ: 1.0000 mg | SUBCUTANEOUS | 0 refills | Status: AC

## 2024-06-02 MED ORDER — ROSUVASTATIN CALCIUM 20 MG PO TABS: 20.0000 mg | ORAL_TABLET | Freq: Every day | ORAL | 3 refills | Status: AC

## 2024-06-02 MED ORDER — SEMAGLUTIDE-WEIGHT MANAGEMENT 0.25 MG/0.5ML ~~LOC~~ SOAJ: 0.2500 mg | SUBCUTANEOUS | 0 refills | Status: AC

## 2024-06-02 MED ORDER — SEMAGLUTIDE-WEIGHT MANAGEMENT 2.4 MG/0.75ML ~~LOC~~ SOAJ: 2.4000 mg | SUBCUTANEOUS | 0 refills | Status: AC

## 2024-06-02 MED ORDER — SEMAGLUTIDE-WEIGHT MANAGEMENT 0.5 MG/0.5ML ~~LOC~~ SOAJ: 0.5000 mg | SUBCUTANEOUS | 0 refills | Status: AC

## 2024-06-02 MED ORDER — SEMAGLUTIDE-WEIGHT MANAGEMENT 1.7 MG/0.75ML ~~LOC~~ SOAJ: 1.7000 mg | SUBCUTANEOUS | 0 refills | Status: AC

## 2024-06-02 MED ORDER — LISINOPRIL 10 MG PO TABS: 10.0000 mg | ORAL_TABLET | Freq: Every day | ORAL | 3 refills | Status: AC

## 2024-06-02 MED ORDER — LEVOTHYROXINE SODIUM 50 MCG PO TABS: 50.0000 ug | ORAL_TABLET | Freq: Every day | ORAL | 3 refills | Status: AC

## 2024-06-02 NOTE — Progress Notes (Signed)
 "  Subjective:   Patient ID: Holly Mcdaniel, female    DOB: [birth year], 59 y.o.   MRN: [id number]  The patient is here for physical. Pertinent topics discussed: Discussed the use of AI scribe software for clinical note transcription with the patient, who gave verbal consent to proceed.  History of Present Illness Holly Mcdaniel is a 58 year old female who presents with concerns about a fractured vertebra and weight management issues as well as recent ERCP with stone removal and sludge removal.  She has been dealing with a fractured T5 vertebra, which she suspects may have occurred during a EUS as she was repositioned several times. She then had 2 more procedures to try to remove gallbladder stones and sludge. The fracture was discovered after experiencing significant back pain, which worsened following a massage. She was unable to lie down due to the pain and had to sleep in a recliner for three nights. An urgent care visit and subsequent x-ray confirmed the fracture. She has been released from care by [facility] but still experiences occasional tightness in the area. She is concerned about the possibility of osteoporosis and her husband has been urging her to get tested. Her husband has been urging her to get tested for osteoporosis.  She has experienced significant weight gain, approximately 40 pounds, after discontinuing Wegovy  due to insurance issues. She initially lost weight on Wegovy  but found the process of obtaining the medication too stressful due to insurance and pharmacy complications. She attributes some of her weight issues to menopause, which has also affected her sleep and energy levels. She feels fatigued despite adequate sleep and experiences frequent nighttime urination. She has a history of prediabetes and is concerned about her current status, as she feels she might be 'tipping over the edge.' She is interested in exploring other weight management medications like Ozempic  or  returning to Wegovy  if insurance allows.  She feels bloated and gassy after eating, particularly after consuming small amounts of food, but denies any significant pain. She attributes some of these symptoms to menopause.   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   PMH, [facility], social history reviewed and updated  Review of Systems  Constitutional: Negative.   HENT: Negative.    Eyes: Negative.   Respiratory:  Negative for cough, chest tightness and shortness of breath.   Cardiovascular:  Negative for chest pain, palpitations and leg swelling.  Gastrointestinal:  Positive for abdominal distention. Negative for abdominal pain, constipation, diarrhea, nausea and vomiting.  Musculoskeletal: Negative.   Skin: Negative.   Neurological: Negative.   Psychiatric/Behavioral: Negative.      Objective:  Physical Exam Constitutional:      Appearance: She is well-developed.  HENT:     Head: Normocephalic and atraumatic.  Cardiovascular:     Rate and Rhythm: Normal rate and regular rhythm.  Pulmonary:     Effort: Pulmonary effort is normal. No respiratory distress.     Breath sounds: Normal breath sounds. No wheezing or rales.  Abdominal:     General: Bowel sounds are normal. There is no distension.     Palpations: Abdomen is soft.     Tenderness: There is no abdominal tenderness.  Musculoskeletal:     Cervical back: Normal range of motion.  Skin:    General: Skin is warm and dry.  Neurological:     Mental Status: She is alert and oriented to person,  place, and time.     Coordination: Coordination normal.     Vitals:   06/02/24 0745  BP: 112/70  Pulse: 65  Temp: 97.6 F (36.4 C)  TempSrc: Oral  SpO2: 97%  Weight: 237 lb (107.5 kg)  Height: 5' 6 (1.676 m)    Assessment & Plan:    "

## 2024-06-02 NOTE — Assessment & Plan Note (Signed)
 Stable and reminded no NSAIDS which she is aware and adheres to. She is using tylenol  for pain only prn.

## 2024-06-02 NOTE — Assessment & Plan Note (Signed)
Checking TSH and adjust levothyroxine as needed.

## 2024-06-02 NOTE — Assessment & Plan Note (Signed)
 Flu shot up to date. Pneumonia up to date. Shingrix need to get records to see if done. Tetanus up to date. Colonoscopy up to date. Mammogram up to date, pap smear up to date with gyn and dexa ordered as needed. Counseled about sun safety and mole surveillance. Counseled about the dangers of distracted driving. Given 10 year screening recommendations.

## 2024-06-02 NOTE — Assessment & Plan Note (Signed)
 Checking vitamin D  and adjust as needed.

## 2024-06-02 NOTE — Assessment & Plan Note (Signed)
 Checking renal function panel and refilled lisinopril  10 mg daily. Seeing nephrology as well.

## 2024-06-02 NOTE — Assessment & Plan Note (Signed)
 Checking lipid panel and adjust crestor as needed.

## 2024-06-02 NOTE — Assessment & Plan Note (Signed)
 Checking HGA1c and adjust as needed.

## 2024-06-02 NOTE — Patient Instructions (Addendum)
 SABRA

## 2024-06-02 NOTE — Assessment & Plan Note (Signed)
 Checking DEXA and we discussed how this can be considered new osteoporosis with no trauma and the repositioning may or may not be considered a reason for the fracture. Depending on results consider treatment of bone density. Checking calcium  and vitamin d levels.

## 2024-06-02 NOTE — Assessment & Plan Note (Signed)
 BMI 38 and complicated by hyperlipidemia. Rx wegovy  standard titration and follow up 3 months or so. She has previously done well with this. Counseled about diet and exercise and she plans to maintain this as well during treatment.

## 2024-06-02 NOTE — Assessment & Plan Note (Signed)
 Recent ERCP with stone removal and due for hepatic function panel to assess LFTs which is ordered today.

## 2024-06-05 ENCOUNTER — Other Ambulatory Visit (HOSPITAL_COMMUNITY): Payer: Self-pay

## 2024-06-05 ENCOUNTER — Telehealth: Payer: Self-pay

## 2024-06-05 ENCOUNTER — Ambulatory Visit: Payer: Self-pay | Admitting: Internal Medicine

## 2024-06-05 DIAGNOSIS — N02B9 Other recurrent and persistent immunoglobulin A nephropathy: Secondary | ICD-10-CM

## 2024-06-05 NOTE — Telephone Encounter (Signed)
 Pharmacy Patient Advocate Encounter   Received notification from RX Request Messages that prior authorization for Wegovy  0.25mg /0.85ml is required/requested.   Insurance verification completed.   The patient is insured through [facility].   Per test claim: Per test claim, medication is not covered due to plan/benefit exclusion, PA not submitted at this time   Anti-obesity medication not covered, product or service not covered.    There is a self pay option to where the patient can pay as low as $199 with a saving card for the first 2 months and then the cost would be $349 per month

## 2024-09-04 ENCOUNTER — Ambulatory Visit: Admitting: Internal Medicine

## 2024-09-12 ENCOUNTER — Ambulatory Visit: Admitting: Obstetrics and Gynecology

## 2025-02-26 ENCOUNTER — Ambulatory Visit: Admitting: Hematology and Oncology
